# Patient Record
Sex: Female | Born: 1980
Health system: Southern US, Community
[De-identification: ages and names within clinical notes are randomized; demographics above are authoritative.]

## PROBLEM LIST (undated history)

## (undated) DIAGNOSIS — H547 Unspecified visual loss: Secondary | ICD-10-CM

## (undated) DIAGNOSIS — F419 Anxiety disorder, unspecified: Secondary | ICD-10-CM

## (undated) DIAGNOSIS — J40 Bronchitis, not specified as acute or chronic: Secondary | ICD-10-CM

## (undated) DIAGNOSIS — R0683 Snoring: Secondary | ICD-10-CM

## (undated) DIAGNOSIS — M797 Fibromyalgia: Secondary | ICD-10-CM

## (undated) DIAGNOSIS — K219 Gastro-esophageal reflux disease without esophagitis: Secondary | ICD-10-CM

## (undated) DIAGNOSIS — N926 Irregular menstruation, unspecified: Secondary | ICD-10-CM

## (undated) DIAGNOSIS — K589 Irritable bowel syndrome without diarrhea: Secondary | ICD-10-CM

## (undated) DIAGNOSIS — G8929 Other chronic pain: Secondary | ICD-10-CM

## (undated) DIAGNOSIS — G43909 Migraine, unspecified, not intractable, without status migrainosus: Secondary | ICD-10-CM

## (undated) DIAGNOSIS — F329 Major depressive disorder, single episode, unspecified: Secondary | ICD-10-CM

## (undated) DIAGNOSIS — I7300 Raynaud's syndrome without gangrene: Secondary | ICD-10-CM

## (undated) DIAGNOSIS — F32A Depression, unspecified: Secondary | ICD-10-CM

## (undated) DIAGNOSIS — M25559 Pain in unspecified hip: Secondary | ICD-10-CM

## (undated) DIAGNOSIS — F4320 Adjustment disorder, unspecified: Secondary | ICD-10-CM

## (undated) DIAGNOSIS — D649 Anemia, unspecified: Secondary | ICD-10-CM

## (undated) DIAGNOSIS — R682 Dry mouth, unspecified: Secondary | ICD-10-CM

## (undated) DIAGNOSIS — D242 Benign neoplasm of left breast: Secondary | ICD-10-CM

## (undated) HISTORY — DX: Benign neoplasm of left breast: D24.2

## (undated) HISTORY — DX: Other chronic pain: G89.29

## (undated) HISTORY — DX: Unspecified visual loss: H54.7

## (undated) HISTORY — DX: Anemia, unspecified: D64.9

## (undated) HISTORY — DX: Snoring: R06.83

## (undated) HISTORY — PX: BREAST BIOPSY: SHX20

## (undated) HISTORY — DX: Pain in unspecified hip: M25.559

## (undated) HISTORY — DX: Adjustment disorder, unspecified: F43.20

## (undated) HISTORY — DX: Bronchitis, not specified as acute or chronic: J40

---

## 2013-06-27 DIAGNOSIS — K047 Periapical abscess without sinus: Secondary | ICD-10-CM | POA: Diagnosis not present

## 2013-06-27 DIAGNOSIS — K029 Dental caries, unspecified: Secondary | ICD-10-CM | POA: Diagnosis not present

## 2013-06-27 DIAGNOSIS — K089 Disorder of teeth and supporting structures, unspecified: Secondary | ICD-10-CM | POA: Diagnosis not present

## 2013-09-10 DIAGNOSIS — J209 Acute bronchitis, unspecified: Secondary | ICD-10-CM | POA: Diagnosis not present

## 2014-03-21 DIAGNOSIS — R1084 Generalized abdominal pain: Secondary | ICD-10-CM | POA: Diagnosis not present

## 2014-03-24 DIAGNOSIS — K7689 Other specified diseases of liver: Secondary | ICD-10-CM | POA: Diagnosis not present

## 2014-03-24 DIAGNOSIS — R1084 Generalized abdominal pain: Secondary | ICD-10-CM | POA: Diagnosis not present

## 2014-04-09 DIAGNOSIS — K7689 Other specified diseases of liver: Secondary | ICD-10-CM | POA: Diagnosis not present

## 2014-04-09 DIAGNOSIS — R1011 Right upper quadrant pain: Secondary | ICD-10-CM | POA: Diagnosis not present

## 2014-10-10 ENCOUNTER — Emergency Department (INDEPENDENT_AMBULATORY_CARE_PROVIDER_SITE_OTHER)
Admission: EM | Admit: 2014-10-10 | Discharge: 2014-10-10 | Disposition: A | Discharge status: Home / Self Care | Payer: Medicare Other | Source: Home / Self Care | Attending: Family Medicine | Admitting: Family Medicine

## 2014-10-10 ENCOUNTER — Encounter (HOSPITAL_COMMUNITY): Payer: Self-pay | Admitting: Emergency Medicine

## 2014-10-10 DIAGNOSIS — T148 Other injury of unspecified body region: Secondary | ICD-10-CM

## 2014-10-10 DIAGNOSIS — W57XXXA Bitten or stung by nonvenomous insect and other nonvenomous arthropods, initial encounter: Secondary | ICD-10-CM

## 2014-10-10 HISTORY — DX: Fibromyalgia: M79.7

## 2014-10-10 HISTORY — DX: Migraine, unspecified, not intractable, without status migrainosus: G43.909

## 2014-10-10 HISTORY — DX: Major depressive disorder, single episode, unspecified: F32.9

## 2014-10-10 HISTORY — DX: Irregular menstruation, unspecified: N92.6

## 2014-10-10 HISTORY — DX: Irritable bowel syndrome without diarrhea: K58.9

## 2014-10-10 HISTORY — DX: Depression, unspecified: F32.A

## 2014-10-10 MED ORDER — FLUTICASONE PROPIONATE 0.05 % EX CREA: TOPICAL_CREAM | Freq: Two times a day (BID) | CUTANEOUS | Status: DC

## 2014-10-10 MED ORDER — PERMETHRIN 5 % EX CREA: TOPICAL_CREAM | CUTANEOUS | Status: DC

## 2014-10-10 MED ORDER — HYDROXYZINE HCL 25 MG PO TABS: 25.0000 mg | ORAL_TABLET | Freq: Four times a day (QID) | ORAL | Status: DC

## 2014-10-10 MED ORDER — METHYLPREDNISOLONE ACETATE 80 MG/ML IJ SUSP
80.0000 mg | Freq: Once | INTRAMUSCULAR | Status: AC
  Administered 2014-10-10: 80 mg via INTRAMUSCULAR

## 2014-10-10 MED ORDER — METHYLPREDNISOLONE ACETATE 80 MG/ML IJ SUSP
INTRAMUSCULAR | Status: AC
  Filled 2014-10-10: qty 1

## 2014-10-10 NOTE — ED Notes (Signed)
Mother and child are being seen in the same treatment room, same provider.

## 2014-10-10 NOTE — ED Notes (Signed)
Concerned for chiggers.  Has tried calamine lotion, benadryl

## 2014-10-10 NOTE — ED Provider Notes (Signed)
CSN: [id number]     Arrival date & time 10/10/14  1329 History   First MD Initiated Contact with Patient 10/10/14 1514     Chief Complaint  Patient presents with  . Rash   (Consider location/radiation/quality/duration/timing/severity/associated sxs/prior Treatment) Patient is a 34 y.o. female presenting with rash. The history is provided by the patient.  Rash Location:  Torso and leg Leg rash location:  L lower leg, L upper leg, R upper leg and R lower leg Quality: itchiness   Severity:  Moderate Onset quality:  Sudden Duration:  3 weeks Progression:  Spreading Chronicity:  New Context: insect bite/sting   Context comment:  [place] when husband and daughter developed similar rash , has not improved in spite of different care.   Past Medical History  Diagnosis Date  . Fibromyalgia   . Depression   . IBS (irritable bowel syndrome)   . Migraine   . Menstrual abnormality    Past Surgical History  Procedure Laterality Date  . Cesarean section     No family history on file. Social History  Substance Use Topics  . Smoking status: Never Smoker   . Smokeless tobacco: None  . Alcohol Use: No   OB History    No data available     Review of Systems  Skin: Positive for rash.  All other systems reviewed and are negative.   Allergies  Review of patient's allergies indicates no known allergies.  Home Medications   Prior to Admission medications   Medication Sig Start Date End Date Taking? Authorizing Provider  calamine lotion Apply 1 application topically as needed for itching.   Yes Historical Provider, MD  DiphenhydrAMINE HCl (BENADRYL ALLERGY PO) Take by mouth.   Yes Historical Provider, MD  fluticasone (CUTIVATE) 0.05 % cream Apply topically 2 (two) times daily. 10/10/14   Billy Fischer, MD  Homeopathic Products (ALLERGY MEDICINE PO) Take by mouth.    Historical Provider, MD  hydrOXYzine (ATARAX/VISTARIL) 25 MG tablet Take 1 tablet (25 mg total) by  mouth every 6 (six) hours. Prn itching 10/10/14   Billy Fischer, MD  permethrin (ELIMITE) 5 % cream Use as directed on package, repeat in 1 week 10/10/14   Billy Fischer, MD   Meds Ordered and Administered this Visit   Medications  methylPREDNISolone acetate (DEPO-MEDROL) injection 80 mg (not administered)    BP 135/93 mmHg  Pulse 82  Temp(Src) 98.3 F (36.8 C) (Oral)  Resp 16  SpO2 100%  LMP 09/09/2014 No data found.   Physical Exam  Constitutional: She is oriented to person, place, and time. She appears well-developed and well-nourished. No distress.  Neurological: She is alert and oriented to person, place, and time.  Skin: Skin is warm and dry. Rash noted.  Diffuse pruritic erythematous papular rash on lower body  Nursing note and vitals reviewed.   ED Course  Procedures (including critical care time)  Labs Review Labs Reviewed - No data to display  Imaging Review No results found.   Visual Acuity Review  Right Eye Distance:   Left Eye Distance:   Bilateral Distance:    Right Eye Near:   Left Eye Near:    Bilateral Near:         MDM   1. Multiple insect bites    rx for permithrin, atarax, and depomedrol and cutivate.    Billy Fischer, MD 10/10/14 [phone]

## 2014-12-16 ENCOUNTER — Emergency Department (HOSPITAL_COMMUNITY): Payer: Medicare Other

## 2014-12-16 ENCOUNTER — Emergency Department (HOSPITAL_COMMUNITY)
Admission: EM | Admit: 2014-12-16 | Discharge: 2014-12-16 | Disposition: A | Discharge status: Home / Self Care | Payer: Medicare Other | Attending: Emergency Medicine | Admitting: Emergency Medicine

## 2014-12-16 ENCOUNTER — Encounter (HOSPITAL_COMMUNITY): Payer: Self-pay | Admitting: *Deleted

## 2014-12-16 DIAGNOSIS — S3992XA Unspecified injury of lower back, initial encounter: Secondary | ICD-10-CM | POA: Insufficient documentation

## 2014-12-16 DIAGNOSIS — Z8719 Personal history of other diseases of the digestive system: Secondary | ICD-10-CM | POA: Diagnosis not present

## 2014-12-16 DIAGNOSIS — Y998 Other external cause status: Secondary | ICD-10-CM | POA: Diagnosis not present

## 2014-12-16 DIAGNOSIS — Z8659 Personal history of other mental and behavioral disorders: Secondary | ICD-10-CM | POA: Insufficient documentation

## 2014-12-16 DIAGNOSIS — M549 Dorsalgia, unspecified: Secondary | ICD-10-CM | POA: Diagnosis not present

## 2014-12-16 DIAGNOSIS — S40022A Contusion of left upper arm, initial encounter: Secondary | ICD-10-CM | POA: Insufficient documentation

## 2014-12-16 DIAGNOSIS — Z8679 Personal history of other diseases of the circulatory system: Secondary | ICD-10-CM | POA: Insufficient documentation

## 2014-12-16 DIAGNOSIS — Y9289 Other specified places as the place of occurrence of the external cause: Secondary | ICD-10-CM | POA: Diagnosis not present

## 2014-12-16 DIAGNOSIS — Y9389 Activity, other specified: Secondary | ICD-10-CM | POA: Insufficient documentation

## 2014-12-16 DIAGNOSIS — Z8742 Personal history of other diseases of the female genital tract: Secondary | ICD-10-CM | POA: Diagnosis not present

## 2014-12-16 DIAGNOSIS — M545 Low back pain: Secondary | ICD-10-CM

## 2014-12-16 MED ORDER — NAPROXEN 500 MG PO TABS: 500.0000 mg | ORAL_TABLET | Freq: Two times a day (BID) | ORAL | Status: DC

## 2014-12-16 MED ORDER — IBUPROFEN 400 MG PO TABS
800.0000 mg | ORAL_TABLET | Freq: Once | ORAL | Status: AC
  Administered 2014-12-16: 800 mg via ORAL
  Filled 2014-12-16: qty 2

## 2014-12-16 NOTE — ED Notes (Signed)
See PA assessment 
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
## 2014-12-16 NOTE — Discharge Instructions (Signed)
Back Pain, Adult °Back pain is very common in adults. The cause of back pain is rarely dangerous and the pain often gets better over time. The cause of your back pain may not be known. Some common causes of back pain include: °· Strain of the muscles or ligaments supporting the spine. °· Wear and tear (degeneration) of the spinal disks. °· Arthritis. °· Direct injury to the back. °For many people, back pain may return. Since back pain is rarely dangerous, most people can learn to manage this condition on their own. °HOME CARE INSTRUCTIONS °Watch your back pain for any changes. The following actions may help to lessen any discomfort you are feeling: °· Remain active. It is stressful on your back to sit or stand in one place for long periods of time. Do not sit, drive, or stand in one place for more than 30 minutes at a time. Take short walks on even surfaces as soon as you are able. Try to increase the length of time you walk each day. °· Exercise regularly as directed by your health care provider. Exercise helps your back heal faster. It also helps avoid future injury by keeping your muscles strong and flexible. °· Do not stay in bed. Resting more than 1-2 days can delay your recovery. °· Pay attention to your body when you bend and lift. The most comfortable positions are those that put less stress on your recovering back. Always use proper lifting techniques, including: °¨ Bending your knees. °¨ Keeping the load close to your body. °¨ Avoiding twisting. °· Find a comfortable position to sleep. Use a firm mattress and lie on your side with your knees slightly bent. If you lie on your back, put a pillow under your knees. °· Avoid feeling anxious or stressed. Stress increases muscle tension and can worsen back pain. It is important to recognize when you are anxious or stressed and learn ways to manage it, such as with exercise. °· Take medicines only as directed by your health care provider. Over-the-counter  medicines to reduce pain and inflammation are often the most helpful. Your health care provider may prescribe muscle relaxant drugs. These medicines help dull your pain so you can more quickly return to your normal activities and healthy exercise. °· Apply ice to the injured area: °¨ Put ice in a plastic bag. °¨ Place a towel between your skin and the bag. °¨ Leave the ice on for 20 minutes, 2-3 times a day for the first 2-3 days. After that, ice and heat may be alternated to reduce pain and spasms. °· Maintain a healthy weight. Excess weight puts extra stress on your back and makes it difficult to maintain good posture. °SEEK MEDICAL CARE IF: °· You have pain that is not relieved with rest or medicine. °· You have increasing pain going down into the legs or buttocks. °· You have pain that does not improve in one week. °· You have night pain. °· You lose weight. °· You have a fever or chills. °SEEK IMMEDIATE MEDICAL CARE IF:  °· You develop new bowel or bladder control problems. °· You have unusual weakness or numbness in your arms or legs. °· You develop nausea or vomiting. °· You develop abdominal pain. °· You feel faint. °  °This information is not intended to replace advice given to you by your health care provider. Make sure you discuss any questions you have with your health care provider. °  °Document Released: 01/07/2005 Document Revised: 01/28/2014 Document Reviewed: 05/11/2013 °Elsevier Interactive Patient Education ©2016 Elsevier   Inc. ° °Emergency Department Resource Guide °1) Find a Doctor and Pay Out of Pocket °Although you won't have to find out who is covered by your insurance plan, it is a good idea to ask around and get recommendations. You will then need to call the office and see if the doctor you have chosen will accept you as a new patient and what types of options they offer for patients who are self-pay. Some doctors offer discounts or will set up payment plans for their patients who do not  have insurance, but you will need to ask so you aren't surprised when you get to your appointment. ° °2) Contact Your Local Health Department °Not all health departments have doctors that can see patients for sick visits, but many do, so it is worth a call to see if yours does. If you don't know where your local health department is, you can check in your phone book. The CDC also has a tool to help you locate your state's health department, and many state websites also have listings of all of their local health departments. ° °3) Find a Walk-in Clinic °If your illness is not likely to be very severe or complicated, you may want to try a walk in clinic. These are popping up all over the country in pharmacies, drugstores, and shopping centers. They're usually staffed by nurse practitioners or physician assistants that have been trained to treat common illnesses and complaints. They're usually fairly quick and inexpensive. However, if you have serious medical issues or chronic medical problems, these are probably not your best option. ° °No Primary Care Doctor: °- Call [facility] at  832-8000 - they can help you locate a primary care doctor that  accepts your insurance, provides certain services, etc. °- Physician Referral Service- 1-800-533-3463 ° °Chronic Pain Problems: °Organization         Address  Phone   Notes  °[facility]  (336) 297-2271 Patients need to be referred by their primary care doctor.  ° °Medication Assistance: °Organization         Address  Phone   Notes  °[place] [facility] [address] °[place], [place] [address] (336) 641-8030 --Must be a resident of [place] °-- Must have NO insurance coverage whatsoever (no Medicaid/ Medicare, etc.) °-- The pt. MUST have a primary care doctor that directs their care regularly and follows them in the community °  °[facility]  (866) 331-1348   °[employer]  (888) 892-1162   ° °Agencies that  provide inexpensive medical care: °Organization         Address  Phone   Notes  °[facility]  (336) 832-8035   °[facility]    (336) 832-7272   °[facility] [address] °[place], [place] [address] (336) 832-4777   °[facility] [address], °[place] (336) 271-4999   °[facility]    (336) 373-0678   °[facility]    (336) 272-1050   °[facility] ° [address], [place] Phone:  (336) 832-4444, Fax:  (336) 832-4440 Hours of Operation:  9 am - 6 pm, M-F.  Also accepts Medicaid/Medicare and self-pay.  °[facility] ° [address],  Phone: (336) 832-3150, Fax: (336) 832-3151. Hours of Operation:  8:30 am - 5:30 pm, M-F.  Also accepts Medicaid and self-pay.  °[facility]   [place] [address], [place] Phone: (336) 878-6027   °[facility] [address], [place], [place] (336)723-1848, Ext. 123 Mondays & Thursdays: 7-9 AM.  First 15 patients are seen on a first come, first serve basis. °  ° °Medicaid-accepting [place] Providers: ° °Organization         Address  Phone   Notes  °[facility] [address], [place] (336) 641-2100 Also accepts self-pay patients.  °[facility] [address], [place] ° (336) 856-9996   °[facility] [address], [place] (336) 288-8857   °[facility] [address] [place] [address], [place] (336) 299-7000   °Veita Bland [address], [place]  ° (336) 373-1557 Only accepts [place] Access Medicaid patients after they have their name applied to their card.  ° °Self-Pay (no insurance) in [place]: ° °Organization         Address  Phone   Notes  °Sickle Cell Patients, [facility] [address], [place] (336) 832-1970   °[facility] [address], [place] (336) 832-4400   °[facility] ° [address], [place] (336) 992-4800   °[facility]/Dr. Osei-Bonsu ° [address] [place] [address], [place] or [address], [place] (336) 841-8500 Phone number for both [place] and [place] locations is the same.  °[facility] [address], [place] (336) 299-0000   °[facility] [address] [place] [address], [place] or [address] (336) 852-7530 °(336) 878-2260   °[facility] [address], [place] (336) 350-1642, phone; (336) 294-5005, fax Sees patients 1st and 3rd Saturday of every month.  Must not qualify for public or private insurance (i.e. Medicaid, Medicare, [place] Health Choice, Veterans' Benefits) • Household income should be no more than 200% of the poverty level •The clinic cannot treat you if you are pregnant or think you are pregnant • Sexually transmitted diseases are not treated at the clinic.  ° ° °Dental Care: °Organization         Address  Phone  Notes  °[facility] [address], [place] (336) 641-6152 Accepts children up to age 21 who are enrolled in Medicaid or [place] Health Choice; pregnant women with a Medicaid card; and children who have applied for Medicaid or [place] Health Choice, but were declined, whose parents can pay a reduced fee at time of service.  °[facility] [place]  [address], [place] (336) 641-7733 Accepts children up to age 21 who are enrolled in Medicaid or [place] Health Choice; pregnant women with a Medicaid card; and children who have applied for Medicaid or [place] Health Choice, but were declined, whose parents can pay a reduced fee at time of service.  °[facility] ° [address], [place] (336) 641-4533 Patients are seen by appointment only. Walk-ins  are not accepted. [facility] will see patients 18 years of age and older. °Monday - Tuesday (8am-5pm) °Most Wednesdays (8:30-5pm) °$30 per visit, cash only  °[facility] ° [address], [place] (336) 641-4533 Patients are seen by appointment only. Walk-ins are not accepted. [facility] will see patients 18 years of age   and older. °One Wednesday Evening (Monthly: Volunteer Based).  $30 per visit, cash only  °[facility]  (919) 537-3737 for adults; Children under age 4, call Graduate Pediatric Dentistry at (919) [phone]. Children aged 4-14, please call (919) [phone] to request a pediatric application. ° Dental services are provided in all areas of dental care including fillings, crowns and bridges, complete and partial dentures, implants, gum treatment, root canals, and extractions. Preventive care is also provided. Treatment is provided to both adults and children. °Patients are selected via a lottery and there is often a waiting list. °  °[facility] [address], °[place] ° (336) 763-8833 [email or web address] °  °[facility] [address], [place], [place] (336)723-1848, Ext. 123 Second and Fourth Thursday of each month, opens at 6:30 AM; Clinic ends at 9 AM.  Patients are seen on a first-come first-served basis, and a limited number are seen during each clinic.  ° °[facility] ° [address], [place], [place] (336) 723-7904   Eligibility Requirements °You must have lived in [place], [place], or [place] counties for at least the last three months. °  You cannot be eligible for state or federal sponsored healthcare insurance, including Veterans Administration, Medicaid, or Medicare. °  You generally cannot be eligible for healthcare insurance through your employer.  °  How to apply: °Eligibility screenings are held every Tuesday and Wednesday afternoon from 1:00 pm until 4:00 pm. You do not need an appointment  for the interview!  °[facility] [address], [place], [place] 336-631-2330   °[facility]  336-342-8273   °[facility]  336-703-3100   °[facility]  336-570-6415   ° °Behavioral Health Resources in the Community: °Intensive Outpatient Programs °Organization         Address  Phone  Notes  °[facility] [address], [place], [place] 336-878-6098   °[facility] [address], [place], [place] 336-832-9800   °[facility] [address], [place], [place] ° 336-882-2125   °[facility] [address],  °[place], [place] 1-800-853-5163 or 336-641-4981   °Substance Abuse Resources °Organization         Address  Phone  Notes  °[facility]  336-882-2125   °[facility]  336-784-9470   °[facility]  336-285-9073   °[facility]  336-845-3988   °Residential & Outpatient Substance Abuse Program  1-800-659-3381   °Psychological Services °Organization         Address  Phone  Notes  °[facility]  336- 832-9600   °[facility]  336- 378-7881   °[facility] [address], [place] 1-800-853-5163 or 336-641-4981   ° °Mobile Crisis Teams °Organization         Address  Phone  Notes  °[facility], Mobile Crisis Care Unit  1-877-626-1772   °[facility] °[facility] ° [address] [place], [place] 336-834-9664   °Sharon DeEsch [address] °[place] 336-554-5454   ° °Self-Help/Support Groups °Organization         Address  Phone             Notes  °[facility] - variety of support groups  336- 373-1402 Call for more information  °Narcotics Anonymous (NA), [facility] [address], °[place]  2 meetings at   this location  ° °Residential Treatment Programs °Organization         Address  Phone  Notes  °[facility] [address],    °[place]  1-866-801-8205   °[facility] ° [address], [place], [place] 704-293-8524   °[facility] [address], [place] 336-845-3988 Admissions: 8am-3pm M-F  °[facility] [address]    °[place], [place] 336-841-1104   °[facility] [address], [place], [place] 336-379-7146   °[facility] [address]  °[place], [place] 336-285-9073   °[facility] Intensive Outpatient [address], [place], [place] 336-852-3033   °[facility] [address]  °[place], [place] 1-877-615-2722 or 336-784-9470   °[facility] ([facility] [address] [place], [place] 336-227-7417 Accepts Medicaid  °[facility] [address]  °[place] 1-800-659-3381 Substance Abuse/Addiction Treatment  ° °[place] Behavioral Health Resources °Organization         Address  Phone  Notes  °[facility]  (888) 581-9988   °Julie Brannon, PhD [address] [place], [place]   (336) 349-5553 or (336) 951-0000   °[facility]   [address] °[place], [place] (336) 349-4454   °[facility] [address], [place], [place] (336) 342-8316 Insurance/Medicaid/sponsorship through [facility]  °[facility] [address]                                    [place], [place] (336) 342-8316 Therapy/tele-psych/case  °[facility] [address]  ° [place], [place] (336) 349-2233    °Dr. Arfeen  (336) 349-4544   °[facility] [place]  [employer] [place] [facility] 1) [address],  °2) [address], [place] °3)  [address], [place] (336) 349-3220 °(336) 342-7768 ° °(336) 342-8140   °[place] [facility] (336) 342-1394 or (336) 342-3537 (After Hours)    ° ° ° °
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
## 2015-01-20 DIAGNOSIS — F410 Panic disorder [episodic paroxysmal anxiety] without agoraphobia: Secondary | ICD-10-CM | POA: Diagnosis not present

## 2015-04-10 ENCOUNTER — Encounter (HOSPITAL_COMMUNITY): Payer: Self-pay | Admitting: Emergency Medicine

## 2015-04-10 ENCOUNTER — Emergency Department (INDEPENDENT_AMBULATORY_CARE_PROVIDER_SITE_OTHER)
Admission: EM | Admit: 2015-04-10 | Discharge: 2015-04-10 | Disposition: A | Discharge status: Home / Self Care | Payer: Medicare Other | Source: Home / Self Care | Attending: Family Medicine | Admitting: Family Medicine

## 2015-04-10 DIAGNOSIS — G8929 Other chronic pain: Secondary | ICD-10-CM | POA: Diagnosis not present

## 2015-04-10 DIAGNOSIS — M25551 Pain in right hip: Secondary | ICD-10-CM | POA: Diagnosis not present

## 2015-04-10 MED ORDER — MELOXICAM 7.5 MG PO TABS: 7.5000 mg | ORAL_TABLET | Freq: Two times a day (BID) | ORAL | Status: DC
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
## 2015-04-10 NOTE — Discharge Instructions (Signed)
Use medicine as prescribed and see orthopedist for further eval and care.
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
## 2015-08-29 ENCOUNTER — Encounter (HOSPITAL_COMMUNITY): Payer: Self-pay | Admitting: Emergency Medicine

## 2015-08-29 ENCOUNTER — Emergency Department (HOSPITAL_COMMUNITY)
Admission: EM | Admit: 2015-08-29 | Discharge: 2015-08-29 | Disposition: A | Discharge status: Home / Self Care | Payer: Medicare Other | Attending: Emergency Medicine | Admitting: Emergency Medicine

## 2015-08-29 DIAGNOSIS — H9201 Otalgia, right ear: Secondary | ICD-10-CM | POA: Insufficient documentation

## 2015-08-29 DIAGNOSIS — Z79899 Other long term (current) drug therapy: Secondary | ICD-10-CM | POA: Diagnosis not present

## 2015-08-29 MED ORDER — OXYCODONE-ACETAMINOPHEN 5-325 MG PO TABS
1.0000 | ORAL_TABLET | Freq: Once | ORAL | Status: AC
Start: 2015-08-29 — End: 2015-08-29
  Administered 2015-08-29: 1 via ORAL
  Filled 2015-08-29: qty 1

## 2015-08-29 MED ORDER — AMOXICILLIN 500 MG PO CAPS: 500.0000 mg | ORAL_CAPSULE | Freq: Three times a day (TID) | ORAL | 0 refills | Status: DC

## 2015-08-29 MED ORDER — HYDROCODONE-ACETAMINOPHEN 5-325 MG PO TABS: ORAL_TABLET | ORAL | 0 refills | Status: DC

## 2015-08-29 NOTE — ED Triage Notes (Signed)
Patient states "I have ear infections off and on for months and I haven't been able to see my doctor so this morning I guess I put too much lemon oil in it." Patient tearful at triage. Complaining of pain to right ear.

## 2015-08-29 NOTE — Discharge Instructions (Signed)
Avoid putting anything in your ear.  Return here for any worsening symptoms

## 2015-08-30 ENCOUNTER — Emergency Department (HOSPITAL_COMMUNITY)
Admission: EM | Admit: 2015-08-30 | Discharge: 2015-08-30 | Disposition: A | Discharge status: Home / Self Care | Payer: Medicare Other | Attending: Emergency Medicine | Admitting: Emergency Medicine

## 2015-08-30 ENCOUNTER — Encounter (HOSPITAL_COMMUNITY): Payer: Self-pay | Admitting: Emergency Medicine

## 2015-08-30 DIAGNOSIS — H66011 Acute suppurative otitis media with spontaneous rupture of ear drum, right ear: Secondary | ICD-10-CM | POA: Insufficient documentation

## 2015-08-30 DIAGNOSIS — H9201 Otalgia, right ear: Secondary | ICD-10-CM | POA: Diagnosis present

## 2015-08-30 MED ORDER — NEOMYCIN-POLYMYXIN-HC 3.5-10000-1 OT SUSP: 4.0000 [drp] | Freq: Three times a day (TID) | OTIC | 0 refills | Status: AC

## 2015-08-30 MED ORDER — OXYCODONE-ACETAMINOPHEN 5-325 MG PO TABS: 2.0000 | ORAL_TABLET | ORAL | 0 refills | Status: DC | PRN

## 2015-08-30 NOTE — Discharge Instructions (Signed)
Finish the antibiotic you were prescribed yesterday.  Add the drops as prescribed. You may take the oxycodone prescribed for pain relief in place of the hydrocodone you were prescribed yesterday.   This will make you drowsy - do not drive within 4 hours of taking this medication.

## 2015-08-30 NOTE — ED Notes (Signed)
Patient was prescribed hydrocodone-acetaminophen 5/325 mg and did not want the remaining pills,  The remaining pills where discarded in sharp container in the [place] room in fast track.

## 2015-08-30 NOTE — ED Provider Notes (Signed)
[place] DEPT Provider Note   CSN: [id number] Arrival date & time: 08/30/15  1827  First Provider Contact:  First MD Initiated Contact with Patient 08/30/15 1855        History   Chief Complaint Chief Complaint  Patient presents with  . Otalgia    HPI Destiny Fischer is a 35 y.o. female.  Destiny Fischer is a 35 y.o. Female presenting with persistent right ear pain and now white drainage from the ear since her visit here yesterday during which time she was treated for an ear infection with amoxil and hydrocodone given severe pain.  She reports the ear was also flushed during that visit.  She now has white watery drainage from the ear canal but endorses persistent pain. She denies fevers, chills, bloody discharge, headache or uri type symptoms such as nasal congestion. She states her fibromyalgia makes her pain most intense and hard to control.  She was prescribed hydrocodone yesterday and took 3 tablets but it has not relieved her pain. She presents with this medicine and wants to turn it in so we can destroy it, but is asking for something stronger.      The history is provided by the patient.    Past Medical History:  Diagnosis Date  . Depression   . Fibromyalgia   . IBS (irritable bowel syndrome)   . Menstrual abnormality   . Migraine     There are no active problems to display for this patient.   Past Surgical History:  Procedure Laterality Date  . CESAREAN SECTION      OB History    No data available       Home Medications    Prior to Admission medications   Medication Sig Start Date End Date Taking? Authorizing Provider  amoxicillin (AMOXIL) 500 MG capsule Take 1 capsule (500 mg total) by mouth 3 (three) times daily. 08/29/15   Tammy Triplett, PA-C  Ascorbic Acid (VITAMIN C PO) Take 1 tablet by mouth daily as needed (sickness).    Historical Provider, MD  guaifenesin (ROBITUSSIN) 100 MG/5ML syrup Take 200 mg by mouth 3 (three) times daily as needed for  cough.    Historical Provider, MD  HYDROcodone-acetaminophen (NORCO/VICODIN) 5-325 MG tablet Take one tab po q 4-6 hrs prn pain 08/29/15   Tammy Triplett, PA-C  IRON PO Take 1 tablet by mouth daily.    Historical Provider, MD  MAGNESIUM PO Take 1 tablet by mouth daily.    Historical Provider, MD  meloxicam (MOBIC) 7.5 MG tablet Take 1 tablet (7.5 mg total) by mouth 2 (two) times daily after a meal. Patient not taking: Reported on 08/29/2015 04/10/15   Billy Fischer, MD  Multiple Vitamin (MULTIVITAMINS PO) Take by mouth.    Historical Provider, MD  naproxen (NAPROSYN) 500 MG tablet Take 1 tablet (500 mg total) by mouth 2 (two) times daily. Patient not taking: Reported on 08/29/2015 12/16/14   Gloriann Loan, PA-C  naproxen sodium (ANAPROX) 220 MG tablet Take 220 mg by mouth 2 (two) times daily as needed (headache).    Historical Provider, MD  neomycin-polymyxin-hydrocortisone (CORTISPORIN) 3.5-10000-1 otic suspension Place 4 drops into the right ear 3 (three) times daily. 08/30/15 09/09/15  Evalee Jefferson, PA-C  OVER THE COUNTER MEDICATION Take 2 tablets by mouth daily as needed (menstrual cycle).     Historical Provider, MD  oxyCODONE-acetaminophen (PERCOCET/ROXICET) 5-325 MG tablet Take 2 tablets by mouth every 4 (four) hours as needed for severe pain. 08/30/15   Almyra Free  Arria Naim, PA-C    Family History History reviewed. No pertinent family history.  Social History Social History  Substance Use Topics  . Smoking status: Never Smoker  . Smokeless tobacco: Never Used  . Alcohol use No     Allergies   Other   Review of Systems Review of Systems  Constitutional: Negative for chills and fever.  HENT: Positive for ear discharge and ear pain. Negative for congestion, postnasal drip, rhinorrhea, sinus pressure, sore throat, trouble swallowing and voice change.   Eyes: Negative for discharge.  Respiratory: Negative for cough, shortness of breath, wheezing and stridor.   Cardiovascular: Negative for chest pain.    Gastrointestinal: Negative for abdominal pain.  Genitourinary: Negative.      Physical Exam Updated Vital Signs BP 120/83 (BP Location: Left Arm)   Pulse 81   Temp 99.5 F (37.5 C) (Oral)   Resp 16   Ht 5\' 2"  (1.575 m)   Wt 85.7 kg   LMP 08/15/2015   SpO2 98%   BMI 34.57 kg/m   Physical Exam  Constitutional: She appears well-developed and well-nourished.  HENT:  Head: Normocephalic and atraumatic.  Right Ear: Hearing normal. There is drainage.  Nose: Nose normal.  Mouth/Throat: Uvula is midline, oropharynx is clear and moist and mucous membranes are normal.  Unable to visualize TM secondary to white purulent dc.  No significant outer canal edema.   Eyes: Conjunctivae are normal.  Neck: Normal range of motion.  Cardiovascular: Normal rate, regular rhythm and normal heart sounds.   Pulmonary/Chest: Effort normal and breath sounds normal. She has no wheezes.  Abdominal: There is no tenderness.  Musculoskeletal: Normal range of motion.  Lymphadenopathy:    She has no cervical adenopathy.  Neurological: She is alert.  Skin: Skin is warm and dry.  Psychiatric:  Anxious, tearful.  Nursing note and vitals reviewed.    ED Treatments / Results  Labs (all labs ordered are listed, but only abnormal results are displayed) Labs Reviewed - No data to display  EKG  EKG Interpretation None       Radiology No results found.  Procedures Procedures (including critical care time)  Medications Ordered in ED Medications - No data to display   Initial Impression / Assessment and Plan / ED Course  I have reviewed the triage vital signs and the nursing notes.  Pertinent labs & imaging results that were available during my care of the patient were reviewed by me and considered in my medical decision making (see chart for details).  Clinical Course    Pt with suspected ruptured TM now with purulence in outer canal.  She left the hydrocodone prescribed here ytd as stated  was not working - this was discarded in the sharps bin.  Percocet prescribed.  Referral to ent for recheck in one week.  Cortisporin suspension prescribed. Advised to continue amoxil prescribed ytd.  Final Clinical Impressions(s) / ED Diagnoses   Final diagnoses:  Acute suppurative otitis media of right ear with spontaneous rupture of tympanic membrane, recurrence not specified    New Prescriptions New Prescriptions   NEOMYCIN-POLYMYXIN-HYDROCORTISONE (CORTISPORIN) 3.5-10000-1 OTIC SUSPENSION    Place 4 drops into the right ear 3 (three) times daily.   OXYCODONE-ACETAMINOPHEN (PERCOCET/ROXICET) 5-325 MG TABLET    Take 2 tablets by mouth every 4 (four) hours as needed for severe pain.     Evalee Jefferson, PA-C 08/31/15 1416    Ezequiel Essex, MD 08/31/15 [phone]

## 2015-08-30 NOTE — ED Triage Notes (Signed)
Pt reports having difficulty hearing and pain out of right ear since yesterday. Pt was seen here yesterday for ear pain and given percocet and flushed ear.  Pt ear did not improve with flushing. Pt prescribed amoxicillin and vicodin.

## 2015-08-31 NOTE — ED Provider Notes (Signed)
[facility] DEPT Provider Note   CSN: [id number] Arrival date & time: 08/29/15  1047  First Provider Contact:  First MD Initiated Contact with Patient 08/29/15 1121        History   Chief Complaint Chief Complaint  Patient presents with  . Otalgia    HPI Lashondria Silverwood is a 35 y.o. female.  HPI  Veletta Hargreaves is a 35 y.o. female who presents to the Emergency Department complaining of severe right ear pain for several days.  She states she has been using diluted lemon oil in her ear to help control pain.  She states that she woke up with worsening pain to her ear and used undiluted lemon oil and then began having worsening pain and burning sensation to her ear.  She comes to the ER holding ice packs to her ear stating that is only thing that will help control the pain.  She attempted to flush her ear with water without relief.  She denies fever, sore throat, dizziness, headache.  Past Medical History:  Diagnosis Date  . Depression   . Fibromyalgia   . IBS (irritable bowel syndrome)   . Menstrual abnormality   . Migraine     There are no active problems to display for this patient.   Past Surgical History:  Procedure Laterality Date  . CESAREAN SECTION      OB History    No data available       Home Medications    Prior to Admission medications   Medication Sig Start Date End Date Taking? Authorizing Provider  Ascorbic Acid (VITAMIN C PO) Take 1 tablet by mouth daily as needed (sickness).   Yes Historical Provider, MD  guaifenesin (ROBITUSSIN) 100 MG/5ML syrup Take 200 mg by mouth 3 (three) times daily as needed for cough.   Yes Historical Provider, MD  IRON PO Take 1 tablet by mouth daily.   Yes Historical Provider, MD  MAGNESIUM PO Take 1 tablet by mouth daily.   Yes Historical Provider, MD  Multiple Vitamin (MULTIVITAMINS PO) Take by mouth.   Yes Historical Provider, MD  naproxen sodium (ANAPROX) 220 MG tablet Take 220 mg by mouth 2 (two) times daily as needed  (headache).   Yes Historical Provider, MD  OVER THE COUNTER MEDICATION Take 2 tablets by mouth daily as needed (menstrual cycle).    Yes Historical Provider, MD  amoxicillin (AMOXIL) 500 MG capsule Take 1 capsule (500 mg total) by mouth 3 (three) times daily. 08/29/15   Arrian Manson, PA-C  HYDROcodone-acetaminophen (NORCO/VICODIN) 5-325 MG tablet Take one tab po q 4-6 hrs prn pain 08/29/15   Hedi Barkan, PA-C  meloxicam (MOBIC) 7.5 MG tablet Take 1 tablet (7.5 mg total) by mouth 2 (two) times daily after a meal. Patient not taking: Reported on 08/29/2015 04/10/15   Billy Fischer, MD  naproxen (NAPROSYN) 500 MG tablet Take 1 tablet (500 mg total) by mouth 2 (two) times daily. Patient not taking: Reported on 08/29/2015 12/16/14   Gloriann Loan, PA-C  neomycin-polymyxin-hydrocortisone (CORTISPORIN) 3.5-10000-1 otic suspension Place 4 drops into the right ear 3 (three) times daily. 08/30/15 09/09/15  Evalee Jefferson, PA-C  oxyCODONE-acetaminophen (PERCOCET/ROXICET) 5-325 MG tablet Take 2 tablets by mouth every 4 (four) hours as needed for severe pain. 08/30/15   Evalee Jefferson, PA-C    Family History History reviewed. No pertinent family history.  Social History Social History  Substance Use Topics  . Smoking status: Never Smoker  . Smokeless tobacco: Never Used  .  Alcohol use No     Allergies   Other   Review of Systems Review of Systems  Constitutional: Negative for activity change, appetite change, chills and fever.  HENT: Positive for ear pain. Negative for congestion, ear discharge, facial swelling, rhinorrhea, sore throat and trouble swallowing.   Eyes: Negative for visual disturbance.  Respiratory: Negative for cough, shortness of breath, wheezing and stridor.   Gastrointestinal: Negative for nausea and vomiting.  Musculoskeletal: Negative for neck pain and neck stiffness.  Skin: Negative.   Neurological: Negative for dizziness, numbness and headaches.  Hematological: Negative for adenopathy.    Psychiatric/Behavioral: Negative for confusion.  All other systems reviewed and are negative.    Physical Exam Updated Vital Signs BP (!) 136/111   Pulse 94   Temp 98.5 F (36.9 C)   Resp 18   Ht 5\' 2"  (1.575 m)   Wt 85.7 kg   LMP 08/15/2015   SpO2 96%   BMI 34.57 kg/m   Physical Exam  Constitutional: She is oriented to person, place, and time. She appears well-developed and well-nourished. No distress.  HENT:  Head: Normocephalic and atraumatic.  Right Ear: There is tenderness. No swelling. Tympanic membrane is bulging. Tympanic membrane is not erythematous. A middle ear effusion is present. No hemotympanum.  Left Ear: Tympanic membrane and ear canal normal.  Mouth/Throat: Uvula is midline, oropharynx is clear and moist and mucous membranes are normal. No uvula swelling. No oropharyngeal exudate.  Bulging of the rightTM.  Mild middle ear effusion present.  No drainage or edema of the ear canal, TM appears intact.    Neck: Normal range of motion. Neck supple.  Cardiovascular: Normal rate and regular rhythm.   Pulmonary/Chest: Effort normal and breath sounds normal. No stridor. No respiratory distress.  Musculoskeletal: Normal range of motion.  Lymphadenopathy:    She has no cervical adenopathy.  Neurological: She is alert and oriented to person, place, and time. Coordination normal.  Skin: Skin is warm and dry. No rash noted.  Nursing note and vitals reviewed.    ED Treatments / Results  Labs (all labs ordered are listed, but only abnormal results are displayed) Labs Reviewed - No data to display  EKG  EKG Interpretation None       Radiology No results found.  Procedures Procedures (including critical care time)  Medications Ordered in ED Medications  oxyCODONE-acetaminophen (PERCOCET/ROXICET) 5-325 MG per tablet 1 tablet (1 tablet Oral Given 08/29/15 1126)     Initial Impression / Assessment and Plan / ED Course  I have reviewed the triage vital signs  and the nursing notes.  Pertinent labs & imaging results that were available during my care of the patient were reviewed by me and considered in my medical decision making (see chart for details).  Clinical Course    Pt with bulging of the Right TM.  Used lemon oil in her ear at home.  No edema of canal.    Right ear gently flushed with saline.  Pt feeling better.  rx for amoxil and vicodin  Final Clinical Impressions(s) / ED Diagnoses   Final diagnoses:  Otalgia of right ear    New Prescriptions Discharge Medication List as of 08/29/2015 12:24 PM    START taking these medications   Details  amoxicillin (AMOXIL) 500 MG capsule Take 1 capsule (500 mg total) by mouth 3 (three) times daily., Starting Tue 08/29/2015, Print    HYDROcodone-acetaminophen (NORCO/VICODIN) 5-325 MG tablet Take one tab po q 4-6 hrs prn  pain, Print         Kem Parkinson, PA-C 08/31/15 2026    Veryl Speak, MD 09/04/15 [phone]

## 2015-09-01 ENCOUNTER — Encounter (HOSPITAL_COMMUNITY): Payer: Self-pay | Admitting: Emergency Medicine

## 2015-09-01 ENCOUNTER — Emergency Department (HOSPITAL_COMMUNITY)
Admission: EM | Admit: 2015-09-01 | Discharge: 2015-09-01 | Disposition: A | Discharge status: Home / Self Care | Payer: Medicare Other | Attending: Emergency Medicine | Admitting: Emergency Medicine

## 2015-09-01 DIAGNOSIS — Z792 Long term (current) use of antibiotics: Secondary | ICD-10-CM | POA: Insufficient documentation

## 2015-09-01 DIAGNOSIS — H9211 Otorrhea, right ear: Secondary | ICD-10-CM | POA: Insufficient documentation

## 2015-09-01 DIAGNOSIS — Z79899 Other long term (current) drug therapy: Secondary | ICD-10-CM | POA: Insufficient documentation

## 2015-09-01 DIAGNOSIS — H9201 Otalgia, right ear: Secondary | ICD-10-CM | POA: Diagnosis present

## 2015-09-01 NOTE — ED Triage Notes (Signed)
Pt here for right ear pain for 3 days. Pt ear has been bleeding, pt has ruptured eardrum diagnosed 2 days ago here in ED. Pt has been on antibiotics and pain medication.

## 2015-09-01 NOTE — ED Provider Notes (Signed)
[facility] DEPT Provider Note   CSN: [id number] Arrival date & time: 09/01/15  1131  First Provider Contact:  First MD Initiated Contact with Patient 09/01/15 1237        History   Chief Complaint Chief Complaint  Patient presents with  . Otalgia    HPI Destiny Fischer is a 35 y.o. female.  HPI   Pt is a 35 y/o female with a history of depression, fibromyalgia who presents to the ED for recheck of her right ear. Patient was first seen on 8/8 for severe right ear pain and was diagnosed with otitis media. She was placed on amoxicillin. She will return to the ED on 8/9 complaining of persistent right your pain and white drainage. At that time her TM was unable to be visualized and patient was placed on Cortisporin drops. She returns today with serosanguineous drainage. She states she is no longer having any ear pain. She states that at her last visit she was told she had a ruptured eardrum. Patient is complaining of decreased hearing in that ear since 8/8. She denies headache, fever, chills, visual changes, sore throat, nausea, vomiting.  Past Medical History:  Diagnosis Date  . Depression   . Fibromyalgia   . IBS (irritable bowel syndrome)   . Menstrual abnormality   . Migraine     There are no active problems to display for this patient.   Past Surgical History:  Procedure Laterality Date  . CESAREAN SECTION      OB History    No data available       Home Medications    Prior to Admission medications   Medication Sig Start Date End Date Taking? Authorizing Provider  amoxicillin (AMOXIL) 500 MG capsule Take 1 capsule (500 mg total) by mouth 3 (three) times daily. 08/29/15   Tammy Triplett, PA-C  Ascorbic Acid (VITAMIN C PO) Take 1 tablet by mouth daily as needed (sickness).    Historical Provider, MD  guaifenesin (ROBITUSSIN) 100 MG/5ML syrup Take 200 mg by mouth 3 (three) times daily as needed for cough.    Historical Provider, MD  HYDROcodone-acetaminophen  (NORCO/VICODIN) 5-325 MG tablet Take one tab po q 4-6 hrs prn pain 08/29/15   Tammy Triplett, PA-C  IRON PO Take 1 tablet by mouth daily.    Historical Provider, MD  MAGNESIUM PO Take 1 tablet by mouth daily.    Historical Provider, MD  meloxicam (MOBIC) 7.5 MG tablet Take 1 tablet (7.5 mg total) by mouth 2 (two) times daily after a meal. Patient not taking: Reported on 08/29/2015 04/10/15   Billy Fischer, MD  Multiple Vitamin (MULTIVITAMINS PO) Take by mouth.    Historical Provider, MD  naproxen (NAPROSYN) 500 MG tablet Take 1 tablet (500 mg total) by mouth 2 (two) times daily. Patient not taking: Reported on 08/29/2015 12/16/14   Gloriann Loan, PA-C  naproxen sodium (ANAPROX) 220 MG tablet Take 220 mg by mouth 2 (two) times daily as needed (headache).    Historical Provider, MD  neomycin-polymyxin-hydrocortisone (CORTISPORIN) 3.5-10000-1 otic suspension Place 4 drops into the right ear 3 (three) times daily. 08/30/15 09/09/15  Evalee Jefferson, PA-C  OVER THE COUNTER MEDICATION Take 2 tablets by mouth daily as needed (menstrual cycle).     Historical Provider, MD  oxyCODONE-acetaminophen (PERCOCET/ROXICET) 5-325 MG tablet Take 2 tablets by mouth every 4 (four) hours as needed for severe pain. 08/30/15   Evalee Jefferson, PA-C    Family History History reviewed. No pertinent family history.  Social History Social History  Substance Use Topics  . Smoking status: Never Smoker  . Smokeless tobacco: Never Used  . Alcohol use No     Allergies   Other   Review of Systems Review of Systems  Constitutional: Negative for chills and fever.  HENT: Positive for ear discharge and hearing loss. Negative for congestion, ear pain, facial swelling, sore throat and trouble swallowing.   Respiratory: Negative for cough and shortness of breath.   Gastrointestinal: Negative for nausea and vomiting.  Neurological: Negative for headaches.     Physical Exam Updated Vital Signs BP 119/87 (BP Location: Left Arm)   Pulse 81    Temp 100.1 F (37.8 C) (Oral)   Resp 18   Ht 5\' 2"  (1.575 m)   Wt 85.7 kg   LMP 08/15/2015   SpO2 98%   BMI 34.57 kg/m   Physical Exam  Constitutional: She appears well-developed and well-nourished. No distress.  HENT:  Head: Normocephalic and atraumatic.  Right Ear: There is drainage and swelling. No tenderness.  Left Ear: Tympanic membrane, external ear and ear canal normal. No drainage, swelling or tenderness.  Right ear canal with swelling and serosanguineous drainage, canal appears irritated and TM not completely visualized. Posterior aspect of TM without erythema, it is white, no blood noted and no rupture noted.   Eyes: Conjunctivae are normal.  Pulmonary/Chest: Effort normal. No respiratory distress.  Musculoskeletal: Normal range of motion.  Neurological: She is alert. Coordination normal.  Skin: Skin is warm and dry. She is not diaphoretic.  Psychiatric: She has a normal mood and affect. Her behavior is normal.  Nursing note and vitals reviewed.    ED Treatments / Results  Labs (all labs ordered are listed, but only abnormal results are displayed) Labs Reviewed - No data to display  EKG  EKG Interpretation None       Radiology No results found.  Procedures Procedures (including critical care time)  Medications Ordered in ED Medications - No data to display   Initial Impression / Assessment and Plan / ED Course  I have reviewed the triage vital signs and the nursing notes.  Pertinent labs & imaging results that were available during my care of the patient were reviewed by me and considered in my medical decision making (see chart for details).  Clinical Course   PT with resolving ear pain but continued decrease hearing and discharge. Pt's canal edematous. Pt on Cortisporin drops which will Adequately treat otitis externa and she is still taking amoxicillin which will cover for otitis media. Unable to completely visualize the TM due to swelling in  canal. No mastoid tenderness less concerning for mastoiditis. Instructed pt to f/u with ENT on Monday to have ear reevaluated. It is reassuring that the patient's pain is resolved. I instructed her to continue the eardrops and the amoxicillin. I instructed her to also follow up with [facility] health and wellness. Patient denies fever although temperature was 100.1 upon arrival to the ED. Patient well-appearing and not tachycardic, she is stable for discharge at this time. I discussed strict return precautions. Patient expressed understanding to the discharge instructions.  Final Clinical Impressions(s) / ED Diagnoses   Final diagnoses:  Ear drainage right    New Prescriptions New Prescriptions   No medications on file     Kalman Drape, PA 09/01/15 [place], DO 09/05/15 1639

## 2015-09-01 NOTE — Discharge Instructions (Signed)
Continue to use the ear drops as prescribed. Follow up with [facility] and Wellness to try to get a primary care provider established. Also follow up with Dr. Constance Holster, ENT, regarding your ear.   Return to the emergency department if you experience worsening ear pain, worsening drainage, fever, chills, headache, swelling behind your ear or any other concerning symptoms.

## 2015-09-01 NOTE — ED Notes (Signed)
This is pts third visit for right ear problem.  Denies any pain today, says pain is controlled with last visit.  Here today for blood tingled drainage from right ear and concerned that she may not be complete dose of ear drop because the medication rolls back out of ear.  Pt says she place drops in right ear and keep medication in for about 20 minutes by medication rolls out once she began ADL's  Pt do have samples of blood on tissue (in baggy) of drainage from ear.  This is serosanguineous drainage noted on tissue.  No active bleed noted and no c/o pain at this time.

## 2016-05-29 DIAGNOSIS — T7840XA Allergy, unspecified, initial encounter: Secondary | ICD-10-CM | POA: Diagnosis present

## 2016-05-29 DIAGNOSIS — L539 Erythematous condition, unspecified: Secondary | ICD-10-CM | POA: Diagnosis not present

## 2016-05-29 DIAGNOSIS — Z79899 Other long term (current) drug therapy: Secondary | ICD-10-CM | POA: Diagnosis not present

## 2016-05-30 ENCOUNTER — Emergency Department (HOSPITAL_COMMUNITY)
Admission: EM | Admit: 2016-05-30 | Discharge: 2016-05-30 | Disposition: A | Discharge status: Home / Self Care | Payer: Medicare Other | Attending: Emergency Medicine | Admitting: Emergency Medicine

## 2016-05-30 ENCOUNTER — Encounter (HOSPITAL_COMMUNITY): Payer: Self-pay

## 2016-05-30 DIAGNOSIS — L539 Erythematous condition, unspecified: Secondary | ICD-10-CM

## 2016-05-30 HISTORY — DX: Raynaud's syndrome without gangrene: I73.00

## 2016-05-30 HISTORY — DX: Dry mouth, unspecified: R68.2

## 2016-05-30 NOTE — ED Provider Notes (Signed)
[facility] DEPT Provider Note   CSN: [id number] Arrival date & time: 05/29/16  2343     History   Chief Complaint Chief Complaint  Patient presents with  . Allergic Reaction    HPI Destiny Fischer is a 36 y.o. female.  HPI  36 year old female with a history of fibromyalgia, Sjogren syndrome, and raynaud's presents with bilateral hand burning, itching and redness since waking up on 5/9. Has tried 25 mg benadryl twice with partial relief. No allergens. No cough, wheezing, dyspnea. Never happened to her before. Hands feel warm, and placing them in cool water or ice helps.   Past Medical History:  Diagnosis Date  . Depression   . Dry mouth   . Fibromyalgia   . IBS (irritable bowel syndrome)   . Menstrual abnormality   . Migraine   . Raynaud's phenomenon     There are no active problems to display for this patient.   Past Surgical History:  Procedure Laterality Date  . CESAREAN SECTION      OB History    No data available       Home Medications    Prior to Admission medications   Medication Sig Start Date End Date Taking? Authorizing Provider  amoxicillin (AMOXIL) 500 MG capsule Take 1 capsule (500 mg total) by mouth 3 (three) times daily. 08/29/15   Triplett, Tammy, PA-C  Ascorbic Acid (VITAMIN C PO) Take 1 tablet by mouth daily as needed (sickness).    [provider]  guaifenesin (ROBITUSSIN) 100 MG/5ML syrup Take 200 mg by mouth 3 (three) times daily as needed for cough.    [provider]  HYDROcodone-acetaminophen (NORCO/VICODIN) 5-325 MG tablet Take one tab po q 4-6 hrs prn pain 08/29/15   Triplett, Tammy, PA-C  IRON PO Take 1 tablet by mouth daily.    [provider]  MAGNESIUM PO Take 1 tablet by mouth daily.    [provider]  Multiple Vitamin (MULTIVITAMINS PO) Take by mouth.    [provider]  naproxen sodium (ANAPROX) 220 MG tablet Take 220 mg by mouth 2 (two) times daily as needed (headache).    [provider]  OVER THE COUNTER MEDICATION Take 2 tablets by mouth daily as needed (menstrual cycle).     [provider]  oxyCODONE-acetaminophen (PERCOCET/ROXICET) 5-325 MG tablet Take 2 tablets by mouth every 4 (four) hours as needed for severe pain. 08/30/15   Evalee Jefferson, PA-C    Family History No family history on file.  Social History Social History  Substance Use Topics  . Smoking status: Never Smoker  . Smokeless tobacco: Never Used  . Alcohol use No     Allergies   Other   Review of Systems Review of Systems  Constitutional: Negative for fever.  Respiratory: Negative for cough, shortness of breath and wheezing.   Skin: Positive for color change. Negative for wound.  Neurological: Negative for numbness.  All other systems reviewed and are negative.    Physical Exam Updated Vital Signs BP (!) 119/91 (BP Location: Right Arm)   Pulse 85   Temp 98.3 F (36.8 C) (Oral)   Resp 16   Ht 5\' 2"  (1.575 m)   Wt 192 lb (87.1 kg)   LMP 05/07/2016 (Approximate)   SpO2 98%   BMI 35.12 kg/m   Physical Exam  Constitutional: She is oriented to person, place, and time. She appears well-developed and well-nourished.  HENT:  Head: Normocephalic and atraumatic.  Right Ear:  External ear normal.  Left Ear: External ear normal.  Nose: Nose normal.  Eyes: Right eye exhibits no discharge. Left eye exhibits no discharge.  Cardiovascular: Normal rate, regular rhythm and normal heart sounds.   Pulses:      Radial pulses are 2+ on the right side, and 2+ on the left side.  Pulmonary/Chest: Effort normal and breath sounds normal. No stridor. She has no wheezes.  Abdominal: She exhibits no distension.  Neurological: She is alert and oriented to person, place, and time.  Skin: Skin is warm and dry.  Mild erythema/pink skin, most prominent to bilateral dorsal hands extending to wrists. No focal swelling, mild swelling noted left hand worse than right. Mild warmth.   Nursing  note and vitals reviewed.    ED Treatments / Results  Labs (all labs ordered are listed, but only abnormal results are displayed) Labs Reviewed - No data to display  EKG  EKG Interpretation None       Radiology No results found.  Procedures Procedures (including critical care time)  Medications Ordered in ED Medications - No data to display   Initial Impression / Assessment and Plan / ED Course  I have reviewed the triage vital signs and the nursing notes.  Pertinent labs & imaging results that were available during my care of the patient were reviewed by me and considered in my medical decision making (see chart for details).     Presentation is most c/w erythroderma, especially given how much cool water helps. Continue benadryl (up to 50 mg at a time based on weight). Needs a PCP for further outpatient workup. No signs of systemic allergic reaction  Final Clinical Impressions(s) / ED Diagnoses   Final diagnoses:  Erythroderma    New Prescriptions Discharge Medication List as of 05/30/2016  2:25 AM       Sherwood Gambler, MD 05/30/16 1003

## 2016-05-30 NOTE — ED Triage Notes (Signed)
Pt reports waking up this morning with itching and burning to both hands. Reports taking Benadryl with no relief. No other symptoms reported.

## 2016-07-09 DIAGNOSIS — Z79899 Other long term (current) drug therapy: Secondary | ICD-10-CM | POA: Diagnosis not present

## 2016-07-09 DIAGNOSIS — F410 Panic disorder [episodic paroxysmal anxiety] without agoraphobia: Secondary | ICD-10-CM | POA: Diagnosis not present

## 2017-02-14 DIAGNOSIS — J209 Acute bronchitis, unspecified: Secondary | ICD-10-CM | POA: Diagnosis not present

## 2017-04-21 DIAGNOSIS — N924 Excessive bleeding in the premenopausal period: Secondary | ICD-10-CM | POA: Diagnosis not present

## 2017-04-21 DIAGNOSIS — E875 Hyperkalemia: Secondary | ICD-10-CM | POA: Diagnosis not present

## 2017-04-21 DIAGNOSIS — G43009 Migraine without aura, not intractable, without status migrainosus: Secondary | ICD-10-CM | POA: Diagnosis not present

## 2017-04-21 DIAGNOSIS — K219 Gastro-esophageal reflux disease without esophagitis: Secondary | ICD-10-CM | POA: Diagnosis not present

## 2017-04-21 DIAGNOSIS — E559 Vitamin D deficiency, unspecified: Secondary | ICD-10-CM | POA: Diagnosis not present

## 2017-04-21 DIAGNOSIS — M6281 Muscle weakness (generalized): Secondary | ICD-10-CM | POA: Diagnosis not present

## 2017-04-21 DIAGNOSIS — E6609 Other obesity due to excess calories: Secondary | ICD-10-CM | POA: Diagnosis not present

## 2017-04-21 DIAGNOSIS — E531 Pyridoxine deficiency: Secondary | ICD-10-CM | POA: Diagnosis not present

## 2017-05-13 DIAGNOSIS — Z5321 Procedure and treatment not carried out due to patient leaving prior to being seen by health care provider: Secondary | ICD-10-CM | POA: Diagnosis not present

## 2017-05-29 ENCOUNTER — Other Ambulatory Visit: Payer: Self-pay

## 2017-05-29 ENCOUNTER — Ambulatory Visit (INDEPENDENT_AMBULATORY_CARE_PROVIDER_SITE_OTHER): Payer: Medicare Other | Admitting: Adult Health

## 2017-05-29 ENCOUNTER — Encounter (INDEPENDENT_AMBULATORY_CARE_PROVIDER_SITE_OTHER): Payer: Self-pay

## 2017-05-29 ENCOUNTER — Encounter: Payer: Self-pay | Admitting: Adult Health

## 2017-05-29 VITALS — BP 118/76 | HR 89 | Ht 63.0 in | Wt 220.0 lb

## 2017-05-29 DIAGNOSIS — F329 Major depressive disorder, single episode, unspecified: Secondary | ICD-10-CM | POA: Diagnosis not present

## 2017-05-29 DIAGNOSIS — F32A Depression, unspecified: Secondary | ICD-10-CM

## 2017-05-29 DIAGNOSIS — R1011 Right upper quadrant pain: Secondary | ICD-10-CM

## 2017-05-29 DIAGNOSIS — N921 Excessive and frequent menstruation with irregular cycle: Secondary | ICD-10-CM

## 2017-05-29 DIAGNOSIS — N946 Dysmenorrhea, unspecified: Secondary | ICD-10-CM | POA: Diagnosis not present

## 2017-05-29 NOTE — Progress Notes (Signed)
Patient ID: Destiny Fischer, female   DOB: Jan 19, 1981, 37 y.o.   MRN: [id number] History of Present Illness: Destiny Fischer is a 37 year old white female in complaining of heavy periods since age 60 and wants [place] to check liver, has ?hemanigoma. She is separated and has boyfriend,39 y.o. she does not have her 2 kids she said they were taken from her. She says her periods last about 7-8 days and are heavy the first 3, changes pads every 2 hours and has cramps and clots.She says she has pain RUQ and ingestion at times. She had tubal with last C section and rarely has sex.She has been seen at [facility] and had labs, She says she had bad experience at age37 with speculum exam. She is very talkative and all over the place with her conversation.She says she is "suing the state for taking her kids and ruining her reputation and her marriage and her identity". Her boyfriend is a Administrator and she used to ride with him but is staying with his mom now.   Current Medications, Allergies, Past Medical History, Past Surgical History, Family History and Social History were reviewed in [employer] record.     Review of Systems: Patient denies any headaches, hearing loss, fatigue, blurred vision, shortness of breath, chest pain,  problems with bowel movements, urination. No joint pain or mood swings.See HPI for positives.    Physical Exam:BP 118/76 (BP Location: Left Arm, Patient Position: Sitting, Cuff Size: Large)   Pulse 89   Ht 5\' 3"  (1.6 m)   Wt 220 lb (99.8 kg)   BMI 38.97 kg/m  General:  Well developed, well nourished, no acute distress Skin:  Warm and dry Lungs; Clear to auscultation bilaterally Cardiovascular: Regular rate and rhythm Abdomen:  Soft, no hepatosplenomegaly,diffuse tenderness  Pelvic:  External genitalia is normal in appearance, no lesions.  The vagina is normal in appearance. Urethra has no lesions or masses. The cervix is smooth, no CMT,declines STD testing. Uterus is  felt to be normal size, shape, and contour, and tender to palpation.  No adnexal masses +tenderness noted.Bladder is non tender, no masses felt. Extremities/musculoskeletal:  No swelling or varicosities noted, no clubbing or cyanosis Psych:  No mood changes, alert and cooperative, PHQ 9 score 15, denies being suicidal and declines meds, has seen counselor in past. Destiny Nevin, RN was in attendance during exam.   Impression: 1. RUQ pain   2. Menorrhagia with irregular cycle   3. Dysmenorrhea   4. Depression, unspecified depression type       Plan: Get labs for review from [facility]  Abdominal and pelvic US 5/21 at 8:30 at [facility] request could not go next week) Follow up in 3 weeks   Review handout on ablation

## 2017-06-10 ENCOUNTER — Ambulatory Visit (HOSPITAL_COMMUNITY)
Admission: RE | Admit: 2017-06-10 | Discharge: 2017-06-10 | Disposition: A | Discharge status: Home / Self Care | Payer: Medicare Other | Source: Ambulatory Visit | Attending: Adult Health | Admitting: Adult Health

## 2017-06-10 ENCOUNTER — Telehealth: Payer: Self-pay | Admitting: Adult Health

## 2017-06-10 DIAGNOSIS — N921 Excessive and frequent menstruation with irregular cycle: Secondary | ICD-10-CM | POA: Insufficient documentation

## 2017-06-10 DIAGNOSIS — N946 Dysmenorrhea, unspecified: Secondary | ICD-10-CM | POA: Diagnosis not present

## 2017-06-10 DIAGNOSIS — R1011 Right upper quadrant pain: Secondary | ICD-10-CM

## 2017-06-10 DIAGNOSIS — K824 Cholesterolosis of gallbladder: Secondary | ICD-10-CM | POA: Diagnosis not present

## 2017-06-10 DIAGNOSIS — N920 Excessive and frequent menstruation with regular cycle: Secondary | ICD-10-CM | POA: Diagnosis not present

## 2017-06-10 NOTE — Telephone Encounter (Signed)
Patient called our after hours nurse stating that she had an Ultrasound done earlier today at [facility] and would like for [facility] to give her a call back with the results. Please contact pt

## 2017-06-10 NOTE — Telephone Encounter (Signed)
Pt aware pelvic US was limited but appears normal and abdominal US showed small 4 mm gallbladder polyp and 13 x 12 mm hemangioma in liver and fatty liver.Keep appt next week for further discussion on controlling periods and consider GI referral

## 2017-06-18 ENCOUNTER — Telehealth: Payer: Self-pay | Admitting: Adult Health

## 2017-06-18 ENCOUNTER — Ambulatory Visit (INDEPENDENT_AMBULATORY_CARE_PROVIDER_SITE_OTHER): Payer: Medicare Other | Admitting: Adult Health

## 2017-06-18 ENCOUNTER — Encounter: Payer: Self-pay | Admitting: Adult Health

## 2017-06-18 VITALS — BP 112/88 | HR 92 | Ht 62.0 in | Wt 216.0 lb

## 2017-06-18 DIAGNOSIS — N946 Dysmenorrhea, unspecified: Secondary | ICD-10-CM | POA: Diagnosis not present

## 2017-06-18 DIAGNOSIS — K824 Cholesterolosis of gallbladder: Secondary | ICD-10-CM | POA: Insufficient documentation

## 2017-06-18 DIAGNOSIS — K760 Fatty (change of) liver, not elsewhere classified: Secondary | ICD-10-CM | POA: Diagnosis not present

## 2017-06-18 DIAGNOSIS — R1011 Right upper quadrant pain: Secondary | ICD-10-CM | POA: Insufficient documentation

## 2017-06-18 DIAGNOSIS — N921 Excessive and frequent menstruation with irregular cycle: Secondary | ICD-10-CM | POA: Diagnosis not present

## 2017-06-18 DIAGNOSIS — D1803 Hemangioma of intra-abdominal structures: Secondary | ICD-10-CM

## 2017-06-18 NOTE — Progress Notes (Signed)
  Subjective:     Patient ID: Destiny Fischer, female   DOB: Sep 17, 1980, 37 y.o.   MRN: [id number]  HPI Destiny Fischer is a 37 year old black female, back in follow up, of RUQ pain and heavy periods.  Review of Systems RUQ pain +increase in mucous Heavy periods and painful, sp tubal  Reviewed past medical,surgical, social and family history. Reviewed medications and allergies.     Objective:   Physical Exam BP 112/88 (BP Location: Left Arm, Patient Position: Sitting, Cuff Size: Large)   Pulse 92   Ht 5\' 2"  (1.575 m)   Wt 216 lb (98 kg)   BMI 39.51 kg/m   Talk only; She can't decide if she wants ablation or not, depo and IUD an option too. Will refer to Dr Oneida Alar to evaluate, fatty liver, gallbladder polyp and hemangioma of liver.      Assessment:     1. RUQ pain   2. Menorrhagia with irregular cycle   3. Dysmenorrhea   4. Fatty liver   5. Gallbladder polyp   6. Hemangioma of liver       Plan:     Referred to Dr Oneida Alar F/U prn Call if decides she wants ablation

## 2017-06-18 NOTE — Telephone Encounter (Signed)
Informed patient that there may may not be another GI offices that can get her in sooner for referral.  Patient states she is going to call around other offices and get back to me if she can get in sooner somewhere else.

## 2017-06-18 NOTE — Telephone Encounter (Signed)
Patient called stating that she came into the office tocreseday to see Anderson Malta and she had referred her to Dr. Oneida Alar of for her problem. Patient states that she called Dr. Oneida Alar office and they could schedule her 3 months out. Pt states that her pain is to severe and that she needs this fix as soon as possible. Pt also states that the ER wont help her. Pt would like Anderson Malta to send referral to another [facility] that will see her sooner. Please contact pt

## 2017-06-19 ENCOUNTER — Encounter: Payer: Self-pay | Admitting: Gastroenterology

## 2017-09-18 ENCOUNTER — Encounter: Payer: Self-pay | Admitting: Gastroenterology

## 2017-09-18 ENCOUNTER — Telehealth: Payer: Self-pay | Admitting: Gastroenterology

## 2017-09-18 ENCOUNTER — Ambulatory Visit: Payer: Medicaid Other | Admitting: Gastroenterology

## 2017-09-18 NOTE — Telephone Encounter (Signed)
PATIENT WAS A NO SHOW AND LETTER SENT  °

## 2017-09-18 NOTE — Telephone Encounter (Signed)
REVIEWED-NO ADDITIONAL RECOMMENDATIONS. 

## 2017-09-30 ENCOUNTER — Telehealth: Payer: Self-pay | Admitting: *Deleted

## 2017-09-30 NOTE — Telephone Encounter (Signed)
I called pt to inform her of her appt this Friday at [facility].  Pt started crying saying that she can't come this Friday.  Pt says that she has alot going on in her life right now and can't commit to an appt.  I informed pt that we would be cancelling the referral and informing Marylee Floras.   Manuela Schwartz at [facility] informed. 09-30-17  AS

## 2017-10-03 ENCOUNTER — Ambulatory Visit: Payer: Medicare Other | Admitting: Gastroenterology

## 2017-11-05 ENCOUNTER — Ambulatory Visit: Payer: Self-pay

## 2017-11-05 NOTE — Telephone Encounter (Signed)
Patient called in with c/o "waking up gasping for air." She says "I think I have sleep apnea, which has been going on for about a year, but here lately is getting worse and scary. I have Fibromyalgia and that affects my sleep, but I pretty much have the pain under control. I go to sleep and find myself waking up choking and gasping for air, coughing so hard my chest hurts. I do have stress in my life for the last 3 years having my children taken away, becoming homeless, my husband walking away from my marriage, a whole lot of things. But, I am getting back on track." I asked about other symptoms, she says "I have difficulty breathing mainly at night." According to protocol, see PCP within 2 weeks, appointment scheduled for tomorrow at 0900 with Jodi Mourning, FNP who patient has a new patient appointment in January, 2020. Care advice given, patient verbalized understanding.   Reason for Disposition . Loud snoring is an ongoing problem  (> 2 weeks)  Answer Assessment - Initial Assessment Questions 1. DESCRIPTION: "Tell me about your sleeping problem."      Wake up choking for air, coughing hard 2. ONSET: "How long have you been having trouble sleeping?" (e.g., days, weeks, months)     A year, but getting worse 3. RECURRENT: "Have you had sleeping problems before?"  If yes: "What happened that time?" "What helped your sleeping problem go away in the past?"      Fibromyalgia sleeping problems, but nothing like waking up choking for air 4. STRESS: "Is there anything in your life that is making you feel stressed or tense?"     Yes, for the last 3 years my life has turned upside down, traumatic changes 5. PAIN: "Do you have any pain that is keeping you awake?" (e.g., back pain, headache, abdominal pain)     Fibromyalgia does sometimes 6. CAFFEINE ABUSE: "Do you drink caffeinated beverages, and how much each day?" (e.g., coffee, tea, colas)     Very rarely 7. SUBSTANCE ABUSE: "Do you use any illegal drugs  or alcohol?"     Alcohol socially, no illegal drugs 8. OTHER SYMPTOMS: "Do you have any other symptoms?"  (e.g., difficulty breathing)     Difficulty breathing at night when sleeping  Protocols used: INSOMNIA-A-AH

## 2017-11-06 ENCOUNTER — Ambulatory Visit (INDEPENDENT_AMBULATORY_CARE_PROVIDER_SITE_OTHER)
Admission: RE | Admit: 2017-11-06 | Discharge: 2017-11-06 | Disposition: A | Discharge status: Home / Self Care | Payer: Medicare Other | Source: Ambulatory Visit | Attending: Family | Admitting: Family

## 2017-11-06 ENCOUNTER — Other Ambulatory Visit (INDEPENDENT_AMBULATORY_CARE_PROVIDER_SITE_OTHER): Payer: Medicare Other

## 2017-11-06 ENCOUNTER — Encounter: Payer: Self-pay | Admitting: Family

## 2017-11-06 ENCOUNTER — Ambulatory Visit (INDEPENDENT_AMBULATORY_CARE_PROVIDER_SITE_OTHER): Payer: Medicare Other | Admitting: Family

## 2017-11-06 VITALS — BP 110/88 | HR 92 | Temp 98.6°F | Ht 62.0 in | Wt 216.1 lb

## 2017-11-06 DIAGNOSIS — R7309 Other abnormal glucose: Secondary | ICD-10-CM

## 2017-11-06 DIAGNOSIS — R0602 Shortness of breath: Secondary | ICD-10-CM

## 2017-11-06 DIAGNOSIS — R5383 Other fatigue: Secondary | ICD-10-CM | POA: Diagnosis not present

## 2017-11-06 DIAGNOSIS — G4730 Sleep apnea, unspecified: Secondary | ICD-10-CM

## 2017-11-06 LAB — COMPREHENSIVE METABOLIC PANEL
ALK PHOS: 82 U/L (ref 39–117)
ALT: 9 U/L (ref 0–35)
AST: 9 U/L (ref 0–37)
Albumin: 4.4 g/dL (ref 3.5–5.2)
BILIRUBIN TOTAL: 0.3 mg/dL (ref 0.2–1.2)
BUN: 11 mg/dL (ref 6–23)
CO2: 26 mEq/L (ref 19–32)
CREATININE: 0.82 mg/dL (ref 0.40–1.20)
Calcium: 9.5 mg/dL (ref 8.4–10.5)
Chloride: 105 mEq/L (ref 96–112)
GFR: 82.17 mL/min (ref >60.00)
GLUCOSE: 113 mg/dL — AB (ref 70–99)
Potassium: 4.1 mEq/L (ref 3.5–5.1)
SODIUM: 139 meq/L (ref 135–145)
TOTAL PROTEIN: 7.8 g/dL (ref 6.0–8.3)

## 2017-11-06 LAB — CBC WITH DIFFERENTIAL/PLATELET
Basophils Absolute: 0 10*3/uL (ref 0.0–0.1)
Basophils Relative: 0.5 % (ref 0.0–3.0)
EOS ABS: 0.1 10*3/uL (ref 0.0–0.7)
Eosinophils Relative: 1.5 % (ref 0.0–5.0)
HCT: 35 % — ABNORMAL LOW (ref 36.0–46.0)
Hemoglobin: 11.1 g/dL — ABNORMAL LOW (ref 12.0–15.0)
LYMPHS ABS: 1.8 10*3/uL (ref 0.7–4.0)
Lymphocytes Relative: 21.5 % (ref 12.0–46.0)
MCHC: 31.6 g/dL (ref 30.0–36.0)
MCV: 75.4 fl — ABNORMAL LOW (ref 78.0–100.0)
MONO ABS: 0.5 10*3/uL (ref 0.1–1.0)
Monocytes Relative: 5.3 % (ref 3.0–12.0)
NEUTROS PCT: 71.2 % (ref 43.0–77.0)
Neutro Abs: 6.1 10*3/uL (ref 1.4–7.7)
Platelets: 430 10*3/uL — ABNORMAL HIGH (ref 150.0–400.0)
RBC: 4.64 Mil/uL (ref 3.87–5.11)
RDW: 17.4 % — ABNORMAL HIGH (ref 11.5–15.5)
WBC: 8.5 10*3/uL (ref 4.0–10.5)

## 2017-11-06 LAB — HEMOGLOBIN A1C: HEMOGLOBIN A1C: 5.6 % (ref 4.6–6.5)

## 2017-11-06 LAB — TSH: TSH: 1.6 u[IU]/mL (ref 0.35–4.50)
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
## 2017-11-10 ENCOUNTER — Telehealth: Payer: Self-pay | Admitting: Family

## 2017-11-10 NOTE — Telephone Encounter (Signed)
error 

## 2017-11-20 ENCOUNTER — Telehealth: Payer: Self-pay | Admitting: Obstetrics & Gynecology

## 2017-11-20 NOTE — Telephone Encounter (Signed)
Unable to leave voicemail.

## 2017-11-20 NOTE — Telephone Encounter (Signed)
Informed patient we did not send the referral. Patient states that we did. I informed Jodi Mourning with [facility] sent the referral to [facility].  Patient states "that is you".  I informed the patient that we were [facility] but will have the provider review her chart again and call her back. Pt verbalized understanding.

## 2017-11-20 NOTE — Telephone Encounter (Signed)
Patient presented to the office and stated that we referred her for a sleep study, but they cancelled her appointment and sent her to [facility] [place].  Behavioral Health is needing a referral from [place].  [phone]

## 2017-11-21 NOTE — Telephone Encounter (Signed)
Pt aware she saw Jodi Mourning in October, call her back

## 2017-12-06 DIAGNOSIS — H6693 Otitis media, unspecified, bilateral: Secondary | ICD-10-CM | POA: Diagnosis not present

## 2017-12-06 DIAGNOSIS — F419 Anxiety disorder, unspecified: Secondary | ICD-10-CM | POA: Diagnosis not present

## 2017-12-06 DIAGNOSIS — F4310 Post-traumatic stress disorder, unspecified: Secondary | ICD-10-CM | POA: Diagnosis not present

## 2017-12-06 DIAGNOSIS — F329 Major depressive disorder, single episode, unspecified: Secondary | ICD-10-CM | POA: Diagnosis not present

## 2017-12-29 ENCOUNTER — Encounter

## 2017-12-29 ENCOUNTER — Institutional Professional Consult (permissible substitution): Payer: Medicare Other | Admitting: Neurology

## 2018-01-12 DIAGNOSIS — K219 Gastro-esophageal reflux disease without esophagitis: Secondary | ICD-10-CM | POA: Diagnosis not present

## 2018-01-12 DIAGNOSIS — F329 Major depressive disorder, single episode, unspecified: Secondary | ICD-10-CM | POA: Diagnosis not present

## 2018-01-12 DIAGNOSIS — H9042 Sensorineural hearing loss, unilateral, left ear, with unrestricted hearing on the contralateral side: Secondary | ICD-10-CM | POA: Diagnosis not present

## 2018-01-12 DIAGNOSIS — H9190 Unspecified hearing loss, unspecified ear: Secondary | ICD-10-CM | POA: Diagnosis not present

## 2018-01-12 DIAGNOSIS — M797 Fibromyalgia: Secondary | ICD-10-CM | POA: Diagnosis not present

## 2018-01-12 DIAGNOSIS — F419 Anxiety disorder, unspecified: Secondary | ICD-10-CM | POA: Diagnosis not present

## 2018-01-12 DIAGNOSIS — G43909 Migraine, unspecified, not intractable, without status migrainosus: Secondary | ICD-10-CM | POA: Diagnosis not present

## 2018-01-12 DIAGNOSIS — L299 Pruritus, unspecified: Secondary | ICD-10-CM | POA: Diagnosis not present

## 2018-01-12 DIAGNOSIS — R0989 Other specified symptoms and signs involving the circulatory and respiratory systems: Secondary | ICD-10-CM | POA: Diagnosis not present

## 2018-02-17 ENCOUNTER — Ambulatory Visit: Payer: Medicare Other | Admitting: Family

## 2018-02-17 ENCOUNTER — Encounter

## 2018-03-16 DIAGNOSIS — G43909 Migraine, unspecified, not intractable, without status migrainosus: Secondary | ICD-10-CM | POA: Diagnosis not present

## 2018-03-16 DIAGNOSIS — B353 Tinea pedis: Secondary | ICD-10-CM | POA: Diagnosis not present

## 2018-03-19 DIAGNOSIS — F331 Major depressive disorder, recurrent, moderate: Secondary | ICD-10-CM | POA: Diagnosis not present

## 2018-03-19 DIAGNOSIS — Z79899 Other long term (current) drug therapy: Secondary | ICD-10-CM | POA: Diagnosis not present

## 2018-03-19 DIAGNOSIS — F21 Schizotypal disorder: Secondary | ICD-10-CM | POA: Diagnosis not present

## 2018-05-08 ENCOUNTER — Ambulatory Visit: Payer: Medicare Other | Admitting: Diagnostic Neuroimaging

## 2019-05-18 ENCOUNTER — Encounter: Payer: Self-pay | Admitting: Nurse Practitioner

## 2019-05-18 ENCOUNTER — Other Ambulatory Visit: Payer: Self-pay

## 2019-05-18 ENCOUNTER — Ambulatory Visit (INDEPENDENT_AMBULATORY_CARE_PROVIDER_SITE_OTHER): Payer: Medicare Other | Admitting: Nurse Practitioner

## 2019-05-18 VITALS — BP 108/72 | HR 75 | Temp 99.7°F | Resp 18 | Ht 63.0 in | Wt 218.2 lb

## 2019-05-18 DIAGNOSIS — Z7689 Persons encountering health services in other specified circumstances: Secondary | ICD-10-CM

## 2019-05-18 DIAGNOSIS — Z8739 Personal history of other diseases of the musculoskeletal system and connective tissue: Secondary | ICD-10-CM

## 2019-05-18 DIAGNOSIS — K760 Fatty (change of) liver, not elsewhere classified: Secondary | ICD-10-CM

## 2019-05-18 DIAGNOSIS — Z13228 Encounter for screening for other metabolic disorders: Secondary | ICD-10-CM

## 2019-05-18 DIAGNOSIS — Z13220 Encounter for screening for lipoid disorders: Secondary | ICD-10-CM | POA: Diagnosis not present

## 2019-05-18 DIAGNOSIS — L659 Nonscarring hair loss, unspecified: Secondary | ICD-10-CM | POA: Diagnosis not present

## 2019-05-18 DIAGNOSIS — K219 Gastro-esophageal reflux disease without esophagitis: Secondary | ICD-10-CM

## 2019-05-18 DIAGNOSIS — J310 Chronic rhinitis: Secondary | ICD-10-CM

## 2019-05-18 MED ORDER — FLUTICASONE PROPIONATE 50 MCG/ACT NA SUSP: 2.0000 | Freq: Every day | NASAL | 6 refills | Status: DC

## 2019-05-18 MED ORDER — FAMOTIDINE 20 MG PO TABS: 20.0000 mg | ORAL_TABLET | Freq: Every day | ORAL | 2 refills | Status: DC

## 2019-05-18 MED ORDER — OMEPRAZOLE 40 MG PO CPDR: 40.0000 mg | DELAYED_RELEASE_CAPSULE | Freq: Every day | ORAL | 2 refills | Status: DC

## 2019-05-18 MED ORDER — LORATADINE 10 MG PO TABS: 10.0000 mg | ORAL_TABLET | Freq: Every day | ORAL | 11 refills | Status: DC
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
## 2019-05-19 ENCOUNTER — Other Ambulatory Visit: Payer: Self-pay | Admitting: Nurse Practitioner

## 2019-05-19 DIAGNOSIS — D509 Iron deficiency anemia, unspecified: Secondary | ICD-10-CM

## 2019-05-19 DIAGNOSIS — E785 Hyperlipidemia, unspecified: Secondary | ICD-10-CM

## 2019-05-19 LAB — CBC WITH DIFFERENTIAL/PLATELET
Absolute Monocytes: 395 cells/uL (ref 200–950)
Basophils Absolute: 38 cells/uL (ref 0–200)
Basophils Relative: 0.5 %
Eosinophils Absolute: 198 cells/uL (ref 15–500)
Eosinophils Relative: 2.6 %
HCT: 34.8 % — ABNORMAL LOW (ref 35.0–45.0)
Hemoglobin: 10.7 g/dL — ABNORMAL LOW (ref 11.7–15.5)
Lymphs Abs: 2075 cells/uL (ref 850–3900)
MCH: 24.3 pg — ABNORMAL LOW (ref 27.0–33.0)
MCHC: 30.7 g/dL — ABNORMAL LOW (ref 32.0–36.0)
MCV: 78.9 fL — ABNORMAL LOW (ref 80.0–100.0)
MPV: 9.8 fL (ref 7.5–12.5)
Monocytes Relative: 5.2 %
Neutro Abs: 4894 cells/uL (ref 1500–7800)
Neutrophils Relative %: 64.4 %
Platelets: 434 10*3/uL — ABNORMAL HIGH (ref 140–400)
RBC: 4.41 10*6/uL (ref 3.80–5.10)
RDW: 16.5 % — ABNORMAL HIGH (ref 11.0–15.0)
Total Lymphocyte: 27.3 %
WBC: 7.6 10*3/uL (ref 3.8–10.8)

## 2019-05-19 LAB — COMPLETE METABOLIC PANEL WITH GFR
AG Ratio: 1.6 (calc) (ref 1.0–2.5)
ALT: 8 U/L (ref 6–29)
AST: 13 U/L (ref 10–30)
Albumin: 4.3 g/dL (ref 3.6–5.1)
Alkaline phosphatase (APISO): 77 U/L (ref 31–125)
BUN: 13 mg/dL (ref 7–25)
CO2: 27 mmol/L (ref 20–32)
Calcium: 9.6 mg/dL (ref 8.6–10.2)
Chloride: 104 mmol/L (ref 98–110)
Creat: 0.75 mg/dL (ref 0.50–1.10)
GFR, Est African American: 115 mL/min/{1.73_m2} (ref >=60)
GFR, Est Non African American: 99 mL/min/{1.73_m2} (ref >=60)
Globulin: 2.7 g/dL (calc) (ref 1.9–3.7)
Glucose, Bld: 92 mg/dL (ref 65–99)
Potassium: 4.2 mmol/L (ref 3.5–5.3)
Sodium: 140 mmol/L (ref 135–146)
Total Bilirubin: 0.4 mg/dL (ref 0.2–1.2)
Total Protein: 7 g/dL (ref 6.1–8.1)

## 2019-05-19 LAB — LIPID PANEL
Cholesterol: 206 mg/dL — ABNORMAL HIGH (ref <200)
HDL: 51 mg/dL (ref >=50)
LDL Cholesterol (Calc): 127 mg/dL (calc) — ABNORMAL HIGH
Non-HDL Cholesterol (Calc): 155 mg/dL (calc) — ABNORMAL HIGH (ref <130)
Total CHOL/HDL Ratio: 4 (calc) (ref <5.0)
Triglycerides: 164 mg/dL — ABNORMAL HIGH (ref <150)

## 2019-05-19 LAB — T4, FREE: Free T4: 1.3 ng/dL (ref 0.8–1.8)

## 2019-05-19 LAB — TSH: TSH: 0.81 mIU/L

## 2019-05-19 MED ORDER — FERROUS SULFATE 325 (65 FE) MG PO TBEC: 325.0000 mg | DELAYED_RELEASE_TABLET | Freq: Three times a day (TID) | ORAL | 3 refills | Status: DC

## 2019-05-19 NOTE — Progress Notes (Signed)
Her cholesterol is elevated. If she would like she can take action with lifestyle change: low fat/cholesterol diet, exercise 20 minute 4-5 times per week with recheck in 4 months. If she does not feel she will be able to do that then we should start her on statin.   She is anemic. She should be taking Iron. We will recheck in 4 months. Take the iron with vitamin C for better absorption and colace so that she does not get constipated. Her stools may turn dark and that is normal.  Thyroid levels normal, kidney and liver normal.

## 2019-05-19 NOTE — Progress Notes (Signed)
Referred to dietician for ida and hyperlipid diet.

## 2019-06-29 ENCOUNTER — Telehealth: Payer: Self-pay | Admitting: Nurse Practitioner

## 2019-06-29 NOTE — Telephone Encounter (Signed)
Patient called in requesting referrals advised she would need and office visit. Appointment scheduled.

## 2019-06-30 ENCOUNTER — Other Ambulatory Visit: Payer: Self-pay

## 2019-06-30 ENCOUNTER — Ambulatory Visit (INDEPENDENT_AMBULATORY_CARE_PROVIDER_SITE_OTHER): Payer: Medicare Other | Admitting: Nurse Practitioner

## 2019-06-30 VITALS — BP 116/78 | HR 98 | Temp 98.2°F | Resp 18 | Wt 218.6 lb

## 2019-06-30 DIAGNOSIS — Z8739 Personal history of other diseases of the musculoskeletal system and connective tissue: Secondary | ICD-10-CM | POA: Insufficient documentation

## 2019-06-30 DIAGNOSIS — E785 Hyperlipidemia, unspecified: Secondary | ICD-10-CM | POA: Diagnosis not present

## 2019-06-30 DIAGNOSIS — N898 Other specified noninflammatory disorders of vagina: Secondary | ICD-10-CM | POA: Diagnosis not present

## 2019-06-30 DIAGNOSIS — H04123 Dry eye syndrome of bilateral lacrimal glands: Secondary | ICD-10-CM | POA: Diagnosis not present

## 2019-06-30 DIAGNOSIS — D509 Iron deficiency anemia, unspecified: Secondary | ICD-10-CM

## 2019-06-30 NOTE — Progress Notes (Signed)
Established Patient Office Visit  Subjective:  Patient ID: Destiny Fischer, female    DOB: [birth year]  Age: 39 y.o. MRN: [id number]  CC:  Chief Complaint  Patient presents with  . Referral    find a balance for fibramalgia    HPI Destiny Fischer is a 39 year old female presenting to the clinic with symptoms of dry eyes, dry, dryness, dry vagina, anus. She reports the symptoms have been going on for years. The patient reports she drinks three bottles of other liquids or fluids. The patient has a history of fibromyalgia.  Recent labs showed normal metabolic panel. The patient was found to have iron deficiency anemia and hyperlipidemia however discouraged. Plan she desires a nutritionist referral. The patient reports she has an appointment at the end of this month with nutritionist to discuss diet management rather than medication management for her condition. She did not for discussion with provider regarding education on diet issues mania or cholesterol management.  The patient signed her referrals to ear nose and throat, GYN, and neurologist.  Patient denies chest pain, chest tightness, GU, GI symptoms other than stated, pain, shortness of breath, palpitations, or falls.  Patient stated that she could not afford $500 a month for prescription for iron, vitamin C, stool softener. Patient was notified that these medications are sold otc at approx.six dollars or less each for 51-month supply. Patient stated she does not desire to take medication with better lifestyle changes.    Past Medical History:  Diagnosis Date  . Adjustment disorder   . Anemia   . Bronchitis   . Chronic hip pain   . Depression   . Dry mouth   . Fibromyalgia   . IBS (irritable bowel syndrome)   . Menstrual abnormality   . Migraine   . Poor vision   . Raynaud's phenomenon   . Snoring     Past Surgical History:  Procedure Laterality Date  . CESAREAN SECTION    . CESAREAN SECTION WITH BILATERAL TUBAL LIGATION        Family History  Problem Relation Age of Onset  . Diabetes Maternal Grandmother   . Heart attack Maternal Grandmother   . Hypertension Mother   . Aneurysm Mother   . Cancer Maternal Aunt        brain  . Autism Son   . Mental illness Daughter     Social History   Socioeconomic History  . Marital status: Legally Separated    Spouse name: Not on file  . Number of children: Not on file  . Years of education: Not on file  . Highest education level: Not on file  Occupational History  . Not on file  Tobacco Use  . Smoking status: Never Smoker  . Smokeless tobacco: Never Used  Substance and Sexual Activity  . Alcohol use: Yes    Comment: rarely  . Drug use: No  . Sexual activity: Yes    Birth control/protection: Surgical    Comment: tubal  Other Topics Concern  . Not on file  Social History Narrative  . Not on file   Social Determinants of Health   Financial Resource Strain:   . Difficulty of Paying Living Expenses:   Food Insecurity:   . Worried About Charity fundraiser in the Last Year:   . Arboriculturist in the Last Year:   Transportation Needs:   . Film/video editor (Medical):   [name] Lack of Transportation (Non-Medical):   Physical Activity:   .  Days of Exercise per Week:   . Minutes of Exercise per Session:   Stress:   . Feeling of Stress :   Social Connections:   . Frequency of Communication with Friends and Family:   . Frequency of Social Gatherings with Friends and Family:   . Attends Religious Services:   . Active Member of Clubs or Organizations:   . Attends Archivist Meetings:   [name] Marital Status:   Intimate Partner Violence:   . Fear of Current or Ex-Partner:   . Emotionally Abused:   [name] Physically Abused:   . Sexually Abused:     Outpatient Medications Prior to Visit  Medication Sig Dispense Refill  . famotidine (PEPCID) 20 MG tablet Take 1 tablet (20 mg total) by mouth at bedtime. 30 tablet 2  . ferrous sulfate (FE TABS) 325  (65 FE) MG EC tablet Take 1 tablet (325 mg total) by mouth 3 (three) times daily with meals. 90 tablet 3  . fluticasone (FLONASE) 50 MCG/ACT nasal spray Place 2 sprays into both nostrils daily. 16 g 6  . loratadine (CLARITIN) 10 MG tablet Take 1 tablet (10 mg total) by mouth daily. 30 tablet 11  . naproxen sodium (ANAPROX) 220 MG tablet Take 220 mg by mouth 2 (two) times daily as needed (headache).    [name] omeprazole (PRILOSEC) 40 MG capsule Take 1 capsule (40 mg total) by mouth daily. 30 capsule 2  . OVER THE COUNTER MEDICATION Take 2 tablets by mouth daily as needed (menstrual cycle).     . THEANINE PO Take by mouth daily.    [name] UNABLE TO FIND Muscle milk-every other day     No facility-administered medications prior to visit.    Allergies  Allergen Reactions  . Other Anaphylaxis    peppers    ROS Review of Systems  All other systems reviewed and are negative.     Objective:    Physical Exam  Constitutional: She is oriented to person, place, and time. She appears well-developed and well-nourished.  HENT:  Head: Normocephalic.  Right Ear: Hearing normal.  Left Ear: Hearing normal.  Nose: No rhinorrhea.  Mouth/Throat: Oropharynx is clear and moist.  Eyes: Pupils are equal, round, and reactive to light. Conjunctivae, EOM and lids are normal. Lids are everted and swept, no foreign bodies found. Right conjunctiva is not injected. Left conjunctiva is not injected. Right eye exhibits normal extraocular motion and no nystagmus. Left eye exhibits normal extraocular motion and no nystagmus.  Neck: No JVD present. No thyromegaly present.  Cardiovascular: Normal rate.  Abdominal: Normal appearance.  Musculoskeletal:        General: Normal range of motion.     Cervical back: Normal range of motion.  Neurological: She is alert and oriented to person, place, and time.  Skin: Skin is warm.  Psychiatric: Her speech is normal and behavior is normal. Cognition and memory are normal.  Nursing  note and vitals reviewed.   BP 116/78 (BP Location: Left Arm, Patient Position: Sitting, Cuff Size: Large)   Pulse 98   Temp 98.2 F (36.8 C) (Temporal)   Resp 18   Wt 218 lb 9.6 oz (99.2 kg)   SpO2 97%   BMI 38.72 kg/m  Wt Readings from Last 3 Encounters:  06/30/19 218 lb 9.6 oz (99.2 kg)  05/18/19 218 lb 3.2 oz (99 kg)  11/06/17 216 lb 1.9 oz (98 kg)     Health Maintenance Due  Topic Date Due  . Hepatitis  C Screening  Never done  . COVID-19 Vaccine (1) Never done  . PAP SMEAR-Modifier  Never done    There are no preventive care reminders to display for this patient.  Lab Results  Component Value Date   TSH 0.81 05/18/2019   Lab Results  Component Value Date   WBC 7.6 05/18/2019   HGB 10.7 (L) 05/18/2019   HCT 34.8 (L) 05/18/2019   MCV 78.9 (L) 05/18/2019   PLT 434 (H) 05/18/2019   Lab Results  Component Value Date   NA 140 05/18/2019   K 4.2 05/18/2019   CO2 27 05/18/2019   GLUCOSE 92 05/18/2019   BUN 13 05/18/2019   CREATININE 0.75 05/18/2019   BILITOT 0.4 05/18/2019   ALKPHOS 82 11/06/2017   AST 13 05/18/2019   ALT 8 05/18/2019   PROT 7.0 05/18/2019   ALBUMIN 4.4 11/06/2017   CALCIUM 9.6 05/18/2019   GFR 82.17 11/06/2017   Lab Results  Component Value Date   CHOL 206 (H) 05/18/2019   Lab Results  Component Value Date   HDL 51 05/18/2019   Lab Results  Component Value Date   LDLCALC 127 (H) 05/18/2019   Lab Results  Component Value Date   TRIG 164 (H) 05/18/2019   Lab Results  Component Value Date   CHOLHDL 4.0 05/18/2019   Lab Results  Component Value Date   HGBA1C 5.6 11/06/2017      Assessment & Plan:   Problem List Items Addressed This Visit      Genitourinary   Dryness of vagina   Relevant Orders   Ambulatory referral to Gynecology     Other   Hyperlipidemia   Iron deficiency anemia   H/O fibromyalgia   Relevant Orders   Ambulatory referral to Neurology   Dry eyes - Primary   Relevant Orders   Ambulatory  referral to ENT    Follow up for repeat labs in 4 months one week prior to apt. Drink plenty of water daily Get at least 20 minutes of exercise 4-5 times per week Eat low fat, low cholesterol , low carb, low sugar diet.  Nutritionist apt as per pt at the ned of this month for IDA and Hyperlipidemia diet pt would like to avoid medications Referral to ENT for dry eyes, nose, ears Referral to neurologist for Fibromyalgia management Referral to GYN dry vagina  Eat a diet rich in Iron   No orders of the defined types were placed in this encounter.   Follow-up: Return in about 4 months (around 10/30/2019) for labs one week prior.    Annie Main, FNP

## 2019-06-30 NOTE — Patient Instructions (Addendum)
Follow up for repeat labs in 4 months one week prior to apt. Drink plenty of water daily Get at least 20 minutes of exercise 4-5 times per week Eat low fat, low cholesterol , low carb, low sugar diet.  Nutritionist apt as per pt at the ned of this month for IDA and Hyperlipidemia diet pt would like to avoid medications Referral to ENT for dry eyes, nose, ears Referral to neurologist for Fibromyalgia management Referral to GYN dry vagina  Eat a diet rich in Iron

## 2019-07-01 ENCOUNTER — Telehealth: Payer: Self-pay | Admitting: Nurse Practitioner

## 2019-07-01 NOTE — Telephone Encounter (Signed)
See note

## 2019-07-01 NOTE — Telephone Encounter (Signed)
The neurologist that [facility] sent the referral to for Fibromyalgia does not take those pts diagnoses. This is a neurological condition however on occasion psychiatry, rheumatology, and other neurologist will manage. I do know there are some great neurologist in [facility] that manage and treat this condition maybe refer outside of [facility].

## 2019-07-09 ENCOUNTER — Telehealth: Payer: Self-pay | Admitting: Adult Health

## 2019-07-09 NOTE — Telephone Encounter (Signed)
Voicemail box full, was not able to reach patient to review covid health screening questions. Will screen patient upon appointment on Monday.

## 2019-07-12 ENCOUNTER — Ambulatory Visit (INDEPENDENT_AMBULATORY_CARE_PROVIDER_SITE_OTHER): Payer: Medicare Other | Admitting: Adult Health

## 2019-07-12 ENCOUNTER — Encounter: Payer: Self-pay | Admitting: Adult Health

## 2019-07-12 VITALS — BP 121/85 | HR 97 | Ht 62.0 in | Wt 216.0 lb

## 2019-07-12 DIAGNOSIS — N760 Acute vaginitis: Secondary | ICD-10-CM

## 2019-07-12 DIAGNOSIS — B9689 Other specified bacterial agents as the cause of diseases classified elsewhere: Secondary | ICD-10-CM | POA: Insufficient documentation

## 2019-07-12 DIAGNOSIS — N649 Disorder of breast, unspecified: Secondary | ICD-10-CM

## 2019-07-12 DIAGNOSIS — N898 Other specified noninflammatory disorders of vagina: Secondary | ICD-10-CM | POA: Insufficient documentation

## 2019-07-12 DIAGNOSIS — N6320 Unspecified lump in the left breast, unspecified quadrant: Secondary | ICD-10-CM | POA: Insufficient documentation

## 2019-07-12 LAB — POCT WET PREP (WET MOUNT)
Clue Cells Wet Prep Whiff POC: POSITIVE
WBC, Wet Prep HPF POC: POSITIVE

## 2019-07-12 MED ORDER — METRONIDAZOLE 500 MG PO TABS: 500.0000 mg | ORAL_TABLET | Freq: Two times a day (BID) | ORAL | 1 refills | Status: DC

## 2019-07-12 NOTE — Progress Notes (Signed)
  Subjective:     Patient ID: Destiny Fischer, female   DOB: February 22, 1980, 39 y.o.   MRN: [id number]  HPI Destiny Fischer is a 39 year old white female, separated, in complaining of left breat mass and vaginal dryness then vaginal discharge. She does not wear a mask has PTSD.  PCP is [employer] FNP  Review of Systems Has left breast mass, getting bigger and tender Has vaginal dryness then vaginal discharge with odor and itching Denies any sexually active Reviewed past medical,surgical, social and family history. Reviewed medications and allergies.     Objective:   Physical Exam BP 121/85 (BP Location: Left Arm, Patient Position: Sitting, Cuff Size: Normal)   Pulse 97   Ht 5\' 2"  (1.575 m)   Wt 216 lb (98 kg)   BMI 39.51 kg/m   Skin warm and dry,  Breasts:no dominate palpable mass, retraction or nipple discharge on the right, on the left has 6 x 5 cm mass at 11-1 o'clock oval and mobile, no retraction or nipple discharge, she had bx 12 years ago for fibroadenoma she thinks Pelvic: external genitalia is normal in appearance no lesions, vagina: white discharge with odor,urethra has no lesions or masses noted, cervix:smooth and bulbous, uterus: normal size, shape and contour, non tender, no masses felt, adnexa: no masses or tenderness noted. Bladder is non tender and no masses felt. Wet prep: + for clue cells and +WBCs. Examination chaperoned by Celene Squibb LPN    Assessment:     1. Left breast mass Diagnostic bilateral mammogram and left and right [place] of breasts if needed at [facility] 7/13 at 2 pm  2. Vaginal discharge rx flagyl  3. Vaginal odor rx falgyl  4. Vaginal itching rx flagyl  5. BV (bacterial vaginosis) Will rx flagyl Meds ordered this encounter  Medications  . metroNIDAZOLE (FLAGYL) 500 MG tablet    Sig: Take 1 tablet (500 mg total) by mouth 2 (two) times daily.    Dispense:  14 tablet    Refill:  1    Order Specific Question:   Supervising Provider    Answer:   Elonda Husky, LUTHER H  [[id number]]    6. Disorder of breast, unspecified  08/03/19 at [facility]     Plan:     Return 7/21 for pap and physical with me

## 2019-07-15 ENCOUNTER — Other Ambulatory Visit: Payer: Self-pay

## 2019-07-15 NOTE — Telephone Encounter (Signed)
Requested Prescriptions   Pending Prescriptions Disp Refills  . naproxen sodium (ALEVE) 220 MG tablet 30 tablet 0    Sig: Take 1 tablet (220 mg total) by mouth 2 (two) times daily as needed (headache).     Pt is requesting Rx.

## 2019-07-19 ENCOUNTER — Other Ambulatory Visit: Payer: Self-pay | Admitting: Nurse Practitioner

## 2019-07-19 MED ORDER — NAPROXEN SODIUM 220 MG PO TABS: 220.0000 mg | ORAL_TABLET | Freq: Two times a day (BID) | ORAL | 0 refills | Status: DC | PRN

## 2019-07-21 ENCOUNTER — Encounter: Payer: Self-pay | Admitting: Nutrition

## 2019-07-21 ENCOUNTER — Encounter: Payer: Medicare Other | Attending: Nurse Practitioner | Admitting: Nutrition

## 2019-07-21 ENCOUNTER — Other Ambulatory Visit: Payer: Self-pay

## 2019-07-21 VITALS — Ht 62.0 in | Wt 216.0 lb

## 2019-07-21 DIAGNOSIS — Z8739 Personal history of other diseases of the musculoskeletal system and connective tissue: Secondary | ICD-10-CM

## 2019-07-21 DIAGNOSIS — E669 Obesity, unspecified: Secondary | ICD-10-CM

## 2019-07-21 DIAGNOSIS — K760 Fatty (change of) liver, not elsewhere classified: Secondary | ICD-10-CM

## 2019-07-21 DIAGNOSIS — E782 Mixed hyperlipidemia: Secondary | ICD-10-CM

## 2019-07-21 NOTE — Patient Instructions (Signed)
Goals   Eat three meals a day at times discussed. Increase fresh fruits and vegetables 15 minutes a day of stretching

## 2019-07-21 NOTE — Progress Notes (Signed)
  Medical Nutrition Therapy:  Appt start time: 1100 end time:  1200.  Assessment:  Primary concerns today: Fibromyalgia and wants to lose weight.. Has history of depression, hyperlipidemia, fatty liver and PTSD. Is on diability for her fibromyalgia and mental trauma issues. Lives with her boyfriend who is a Administrator. She is home alone 90% of the time. Stays away from n[place]sh[place] vegetables. Can't eat certain foods.  History of anemic. Likes to eat gluten free foods. Doesn't eat meals on schedule. 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 Doesn't cook due to exhaustion and chronic pain. Drinks muscle milk. Tends to have reasons why she can't get on a meal schedule, prepare meals at home or exercise that would benefit her. Takes  Some herbal supplements for female ,    She has had her kids removed from her custody for the last 6 years. 49 and 64 yr olds. Current diet is inconsistent to meet her nutritional needs that may be contributing to her physical and mental health. Admits to being depressed but hasn't gotten treatment for it. History of opiate addiction.  Preferred Learning Style:  Auditory  Visual  Hands on   Learning Readiness:   Ready  Change in progress   MEDICATIONS:    DIETARY INTAKE:  24-hr recall:  Banana nut muffin, naked mango juice and water Roast beef swiss sandwich, water  or Egg with cheese sandwich at home. Sometimes skips lunch.  Dinner misc eats out from gas station.   Usual physical activity: ADL  Estimated energy needs: 1500  calories 170  g carbohydrates 112 g protein 42 g fat  Progress Towards Goal(s):  In progress.   Nutritional Diagnosis:  NB-1.1 Food and nutrition-related knowledge deficit As related to obesity .  As evidenced by BMI 39 and diet recall that is insuffient to meet her needs.    Intervention:  Nutrition Nutrition education provided on My Plate, CHO counting, meal planning, portion sizes, timing of meals, , benefits of exercising 30  minutes per day and stretching to help her fibromyalgia. Encouraged a plant based diet and eat better balanced consistent meals. [name] .Goals   Eat three meals a day at times discussed. Increase fresh fruits and vegetables 15 minutes a day of stretching    Teaching Method Utilized:  Visual Auditory Hands on  Handouts given during visit include:  The Plate Method   Meal Plan Card     Barriers to learning/adherence to lifestyle change: mental issues and fibromyalgia  Demonstrated degree of understanding via:  Teach Back   Monitoring/Evaluation:  Dietary intake, exercise, and body weight in 3 month(s).

## 2019-07-27 ENCOUNTER — Telehealth: Payer: Self-pay

## 2019-07-27 NOTE — Telephone Encounter (Signed)
Pt called wanting an Rx for naproxyn 500 mg. She states that she has taken the 500 mg in the past and would like to have it again. Please advise.

## 2019-07-28 ENCOUNTER — Other Ambulatory Visit: Payer: Self-pay | Admitting: Nurse Practitioner

## 2019-07-28 NOTE — Telephone Encounter (Signed)
Please ask her the diagnosis the prescriber prescribed this NSAID to treat her for. Thanks.

## 2019-07-29 ENCOUNTER — Other Ambulatory Visit: Payer: Self-pay | Admitting: Nurse Practitioner

## 2019-07-29 DIAGNOSIS — Z8669 Personal history of other diseases of the nervous system and sense organs: Secondary | ICD-10-CM

## 2019-07-29 MED ORDER — NAPROXEN SODIUM 220 MG PO TABS: 220.0000 mg | ORAL_TABLET | Freq: Two times a day (BID) | ORAL | 3 refills | Status: DC | PRN

## 2019-07-29 NOTE — Telephone Encounter (Signed)
Pt states that she has migraines that she takes the naproxyn for. Pt explained that the headaches cluster on one side of her head, she looses vision, and she vomits. I explained to pt that she has not had this filled at 500 mg since 2016. She states that she takes them prn.

## 2019-08-03 ENCOUNTER — Other Ambulatory Visit (HOSPITAL_COMMUNITY): Payer: Medicaid Other

## 2019-08-03 ENCOUNTER — Encounter (HOSPITAL_COMMUNITY): Payer: Medicaid Other

## 2019-08-10 ENCOUNTER — Encounter (HOSPITAL_COMMUNITY): Payer: Medicaid Other

## 2019-08-10 ENCOUNTER — Other Ambulatory Visit (HOSPITAL_COMMUNITY): Payer: Medicaid Other

## 2019-08-11 ENCOUNTER — Other Ambulatory Visit: Payer: Self-pay

## 2019-08-11 ENCOUNTER — Other Ambulatory Visit: Payer: Medicare Other | Admitting: Adult Health

## 2019-08-21 ENCOUNTER — Emergency Department (HOSPITAL_COMMUNITY)
Admission: EM | Admit: 2019-08-21 | Discharge: 2019-08-21 | Disposition: A | Discharge status: Home / Self Care | Payer: Medicare Other | Attending: Emergency Medicine | Admitting: Emergency Medicine

## 2019-08-21 ENCOUNTER — Other Ambulatory Visit: Payer: Self-pay

## 2019-08-21 ENCOUNTER — Encounter (HOSPITAL_COMMUNITY): Payer: Self-pay | Admitting: Emergency Medicine

## 2019-08-21 DIAGNOSIS — Y929 Unspecified place or not applicable: Secondary | ICD-10-CM | POA: Diagnosis not present

## 2019-08-21 DIAGNOSIS — S90452A Superficial foreign body, left great toe, initial encounter: Secondary | ICD-10-CM | POA: Diagnosis not present

## 2019-08-21 DIAGNOSIS — Y939 Activity, unspecified: Secondary | ICD-10-CM | POA: Diagnosis not present

## 2019-08-21 DIAGNOSIS — S91142A Puncture wound with foreign body of left great toe without damage to nail, initial encounter: Secondary | ICD-10-CM

## 2019-08-21 DIAGNOSIS — W312XXA Contact with powered woodworking and forming machines, initial encounter: Secondary | ICD-10-CM | POA: Diagnosis not present

## 2019-08-21 DIAGNOSIS — S90932A Unspecified superficial injury of left great toe, initial encounter: Secondary | ICD-10-CM | POA: Diagnosis present

## 2019-08-21 DIAGNOSIS — Y999 Unspecified external cause status: Secondary | ICD-10-CM | POA: Diagnosis not present

## 2019-08-21 MED ORDER — LIDOCAINE HCL (PF) 1 % IJ SOLN: 5.0000 mL | Freq: Once | INTRAMUSCULAR | Status: DC

## 2019-08-21 MED ORDER — TETANUS-DIPHTH-ACELL PERTUSSIS 5-2.5-18.5 LF-MCG/0.5 IM SUSP
0.5000 mL | Freq: Once | INTRAMUSCULAR | Status: AC
  Administered 2019-08-21: 0.5 mL via INTRAMUSCULAR
  Filled 2019-08-21: qty 0.5

## 2019-08-21 MED ORDER — LIDOCAINE HCL (PF) 2 % IJ SOLN
INTRAMUSCULAR | Status: AC
  Administered 2019-08-21: 5 mL
  Filled 2019-08-21: qty 10

## 2019-08-21 NOTE — Discharge Instructions (Addendum)
Get help right away if: You develop severe swelling around your wound. Your pain suddenly increases and is severe. You develop painful skin lumps. You have a red streak going away from your wound. The wound is on your hand or foot and you: Cannot properly move a finger or toe. Notice that your fingers or toes look pale or bluish.

## 2019-08-21 NOTE — ED Triage Notes (Signed)
Pt c/o of left great toe pain. Stepped on a piece of wood an hour ago.

## 2019-08-21 NOTE — ED Provider Notes (Signed)
[facility] EMERGENCY DEPARTMENT Provider Note   CSN: [id number] Arrival date & time: 08/21/19  1434     History Chief Complaint  Patient presents with  . Toe Pain    Destiny Fischer is a 39 y.o. female. who presents emergency department for splinter to the left great toe.  Patient was outside on her deck and got a splinter in her left great toe.  She was able to pull some of the wood out however there was some area that was retained.  She reports a history of fibromyalgia of the left foot only on her left side.  She states that she has a very low pain tolerance and could not remove it.  HPI     Past Medical History:  Diagnosis Date  . Adjustment disorder   . Anemia   . Bronchitis   . Chronic hip pain   . Depression   . Dry mouth   . Fibromyalgia   . IBS (irritable bowel syndrome)   . Menstrual abnormality   . Migraine   . Poor vision   . Raynaud's phenomenon   . Snoring     Patient Active Problem List   Diagnosis Date Noted  . BV (bacterial vaginosis) 07/12/2019  . Vaginal itching 07/12/2019  . Left breast mass 07/12/2019  . Vaginal discharge 07/12/2019  . Vaginal odor 07/12/2019  . Hyperlipidemia 06/30/2019  . Iron deficiency anemia 06/30/2019  . H/O fibromyalgia 06/30/2019  . Dryness of vagina 06/30/2019  . Dry eyes 06/30/2019  . Hemangioma of liver 06/18/2017  . Gallbladder polyp 06/18/2017  . Fatty liver 06/18/2017  . Dysmenorrhea 06/18/2017  . Menorrhagia with irregular cycle 06/18/2017  . RUQ pain 06/18/2017    Past Surgical History:  Procedure Laterality Date  . CESAREAN SECTION    . CESAREAN SECTION WITH BILATERAL TUBAL LIGATION       OB History    Gravida  2   Para  2   Term  2   Preterm      AB      Living  2     SAB      TAB      Ectopic      Multiple      Live Births  2           Family History  Problem Relation Age of Onset  . Diabetes Maternal Grandmother   . Heart attack Maternal Grandmother   . Hypertension  Mother   . Aneurysm Mother   . Cancer Maternal Aunt        brain  . Autism Son   . Mental illness Daughter     Social History   Tobacco Use  . Smoking status: Never Smoker  . Smokeless tobacco: Never Used  Substance Use Topics  . Alcohol use: Yes    Comment: rarely  . Drug use: No    Home Medications Prior to Admission medications   Medication Sig Start Date End Date Taking? Authorizing Provider  metroNIDAZOLE (FLAGYL) 500 MG tablet Take 1 tablet (500 mg total) by mouth 2 (two) times daily. 07/12/19   Estill Dooms, NP  naproxen sodium (ALEVE) 220 MG tablet Take 1 tablet (220 mg total) by mouth 2 (two) times daily as needed (headache). 07/29/19   Annie Main, FNP  omeprazole (PRILOSEC) 40 MG capsule Take 1 capsule (40 mg total) by mouth daily. 05/18/19   Annie Main, FNP  UNABLE TO FIND Muscle milk-every other day  [provider]    Allergies    Other  Review of Systems   Review of Systems  Skin: Positive for wound.  Neurological: Negative for weakness and numbness.    Physical Exam Updated Vital Signs BP (!) 117/87 (BP Location: Right Arm)   Pulse 86   Temp 99.4 F (37.4 C) (Oral)   Resp 16   Ht 5\' 2"  (1.575 m)   Wt (!) 98 kg   SpO2 99%   BMI 39.51 kg/m   Physical Exam Vitals and nursing note reviewed.  Constitutional:      General: She is not in acute distress.    Appearance: She is well-developed. She is not diaphoretic.  HENT:     Head: Normocephalic and atraumatic.  Eyes:     General: No scleral icterus.    Conjunctiva/sclera: Conjunctivae normal.  Cardiovascular:     Rate and Rhythm: Normal rate and regular rhythm.     Heart sounds: Normal heart sounds. No murmur heard.  No friction rub. No gallop.   Pulmonary:     Effort: Pulmonary effort is normal. No respiratory distress.     Breath sounds: Normal breath sounds.  Abdominal:     General: Bowel sounds are normal. There is no distension.     Palpations: Abdomen is  soft. There is no mass.     Tenderness: There is no abdominal tenderness. There is no guarding.  Musculoskeletal:     Cervical back: Normal range of motion.  Skin:    General: Skin is warm and dry.     Comments: Superficial foreign body under the corneum lucidum of the left great toe pad.  Neurological:     Mental Status: She is alert and oriented to person, place, and time.  Psychiatric:        Behavior: Behavior normal.     ED Results / Procedures / Treatments   Labs (all labs ordered are listed, but only abnormal results are displayed) Labs Reviewed - No data to display  EKG None  Radiology No results found.  Procedures .Foreign Body Removal  Date/Time: 08/21/2019 6:34 PM Performed by: Margarita Mail, PA-C Authorized by: Margarita Mail, PA-C  Body area: skin Anesthesia: local infiltration  Anesthesia: Local Anesthetic: lidocaine 2% without epinephrine Localization method: visualized Removal mechanism: forceps Dressing: dressing applied Tendon involvement: none Depth: subcutaneous Complexity: simple 1 objects recovered. Objects recovered: Wooden splinter Post-procedure assessment: foreign body removed   (including critical care time)  Medications Ordered in ED Medications  Tdap (BOOSTRIX) injection 0.5 mL (0.5 mLs Intramuscular Given 08/21/19 1738)  lidocaine HCl (PF) (XYLOCAINE) 2 % injection (5 mLs  Given 08/21/19 1738)    ED Course  I have reviewed the triage vital signs and the nursing notes.  Pertinent labs & imaging results that were available during my care of the patient were reviewed by me and considered in my medical decision making (see chart for details).    MDM Rules/Calculators/A&P                          39 year old female with splinter in her left great toe.  This was removed after local anesthesia.  No obvious retained pieces of wood.  Patient appears otherwise appropriate for discharge.  Discussed wound care and return  precautions. Final Clinical Impression(s) / ED Diagnoses Final diagnoses:  None    Rx / DC Orders ED Discharge Orders    None       Tuesday Terlecki,  Maryjane Hurter 08/21/19 1836    Sherwood Gambler, MD 08/24/19 [phone]

## 2019-08-24 ENCOUNTER — Ambulatory Visit (HOSPITAL_COMMUNITY)
Admission: RE | Admit: 2019-08-24 | Discharge: 2019-08-24 | Disposition: A | Discharge status: Home / Self Care | Payer: Medicare Other | Source: Ambulatory Visit | Attending: Adult Health | Admitting: Adult Health

## 2019-08-24 ENCOUNTER — Other Ambulatory Visit: Payer: Self-pay

## 2019-08-24 ENCOUNTER — Other Ambulatory Visit (HOSPITAL_COMMUNITY): Payer: Self-pay | Admitting: Adult Health

## 2019-08-24 DIAGNOSIS — N6320 Unspecified lump in the left breast, unspecified quadrant: Secondary | ICD-10-CM

## 2019-08-24 DIAGNOSIS — N649 Disorder of breast, unspecified: Secondary | ICD-10-CM | POA: Insufficient documentation

## 2019-08-25 ENCOUNTER — Telehealth: Payer: Self-pay | Admitting: Adult Health

## 2019-08-25 ENCOUNTER — Other Ambulatory Visit (HOSPITAL_COMMUNITY): Payer: Self-pay | Admitting: Adult Health

## 2019-08-25 ENCOUNTER — Other Ambulatory Visit: Payer: Medicare Other | Admitting: Adult Health

## 2019-08-25 DIAGNOSIS — N6320 Unspecified lump in the left breast, unspecified quadrant: Secondary | ICD-10-CM

## 2019-08-25 NOTE — Telephone Encounter (Signed)
Mail Box is full  

## 2019-08-26 ENCOUNTER — Telehealth: Payer: Self-pay

## 2019-08-26 ENCOUNTER — Encounter: Payer: Self-pay | Admitting: Nurse Practitioner

## 2019-08-26 ENCOUNTER — Other Ambulatory Visit: Payer: Self-pay | Admitting: Nurse Practitioner

## 2019-08-26 DIAGNOSIS — G43909 Migraine, unspecified, not intractable, without status migrainosus: Secondary | ICD-10-CM

## 2019-08-26 DIAGNOSIS — Z8669 Personal history of other diseases of the nervous system and sense organs: Secondary | ICD-10-CM | POA: Insufficient documentation

## 2019-08-26 HISTORY — DX: Migraine, unspecified, not intractable, without status migrainosus: G43.909

## 2019-08-26 MED ORDER — NAPROXEN 500 MG PO TABS: ORAL_TABLET | ORAL | 0 refills | Status: DC

## 2019-08-26 NOTE — Telephone Encounter (Signed)
Pt called and left VM stating that she needs 500 mg of naproxen 3x a day for her migraines. Pt does not know why we can not get the Rx right and send it to her pharmacy. Pt is currently on 220 mg naproxen bid. Please advise.

## 2019-08-26 NOTE — Telephone Encounter (Signed)
I sent twice daily 500 mg as needed this will cover up to 15 days a month for a headache as she said is the reason for taking. I hope this helps.  If this is not enough then we may need a preventative medication for headache. If other pain is an issue then you have my permission for referral to pain management.   [employer] FNP-C

## 2019-09-14 ENCOUNTER — Ambulatory Visit (HOSPITAL_COMMUNITY)
Admission: RE | Admit: 2019-09-14 | Discharge: 2019-09-14 | Disposition: A | Discharge status: Home / Self Care | Payer: Medicare Other | Source: Ambulatory Visit | Attending: Adult Health | Admitting: Adult Health

## 2019-09-14 ENCOUNTER — Other Ambulatory Visit (HOSPITAL_COMMUNITY): Payer: Self-pay | Admitting: Nurse Practitioner

## 2019-09-14 ENCOUNTER — Ambulatory Visit (HOSPITAL_COMMUNITY)
Admission: RE | Admit: 2019-09-14 | Discharge: 2019-09-14 | Disposition: A | Discharge status: Home / Self Care | Payer: Medicare Other | Source: Ambulatory Visit | Attending: Nurse Practitioner | Admitting: Nurse Practitioner

## 2019-09-14 ENCOUNTER — Other Ambulatory Visit: Payer: Self-pay

## 2019-09-14 ENCOUNTER — Other Ambulatory Visit (HOSPITAL_COMMUNITY): Payer: Medicare Other

## 2019-09-14 DIAGNOSIS — N6320 Unspecified lump in the left breast, unspecified quadrant: Secondary | ICD-10-CM

## 2019-09-14 DIAGNOSIS — R928 Other abnormal and inconclusive findings on diagnostic imaging of breast: Secondary | ICD-10-CM | POA: Diagnosis present

## 2019-09-14 MED ORDER — LIDOCAINE-EPINEPHRINE (PF) 1 %-1:200000 IJ SOLN
INTRAMUSCULAR | Status: AC
  Filled 2019-09-14: qty 30

## 2019-09-14 MED ORDER — LIDOCAINE HCL (PF) 1 % IJ SOLN
INTRAMUSCULAR | Status: AC
  Filled 2019-09-14: qty 5

## 2019-09-15 ENCOUNTER — Encounter: Payer: Self-pay | Admitting: Adult Health

## 2019-09-15 ENCOUNTER — Ambulatory Visit (INDEPENDENT_AMBULATORY_CARE_PROVIDER_SITE_OTHER): Payer: Medicare Other | Admitting: Adult Health

## 2019-09-15 VITALS — BP 141/86 | HR 100 | Ht 62.0 in | Wt 223.0 lb

## 2019-09-15 DIAGNOSIS — D242 Benign neoplasm of left breast: Secondary | ICD-10-CM | POA: Diagnosis not present

## 2019-09-15 DIAGNOSIS — Z716 Tobacco abuse counseling: Secondary | ICD-10-CM

## 2019-09-15 LAB — SURGICAL PATHOLOGY

## 2019-09-15 NOTE — Progress Notes (Signed)
°  Subjective:     Patient ID: Destiny Fischer, female   DOB: 1980/07/20, 39 y.o.   MRN: [id number]  HPI Destiny Fischer is a 39 year old white female, single, G2P2, is in today in follow up of left breast mass, she had biopsy yesterday and was a fibroadenoma and on mammogram has 2 large masses in left breast and she would like them removed, since benign, but hey are painful. PCP is Ishmael Holter NP  Review of Systems  had breat biopsy yesterday and is sore now Reviewed past medical,surgical, social and family history. Reviewed medications and allergies.     Objective:   Physical Exam BP (!) 141/86 (BP Location: Left Arm, Patient Position: Sitting, Cuff Size: Normal)    Pulse 100    Ht 5\' 2"  (1.575 m)    Wt 223 lb (101.2 kg)    LMP 09/14/2019    BMI 40.79 kg/m  Skin warm and dry.  Lungs: clear to ausculation bilaterally. Cardiovascular: regular rate and rhythm. Steri strips and band aide on left breast      Upstream - 09/15/19 1433      Pregnancy Intention Screening   Does the patient want to become pregnant in the next year? No    Does the patient's partner want to become pregnant in the next year? N/A    Would the patient like to discuss contraceptive options today? No      Contraception Wrap Up   Current Method Female Sterilization    End Method Female Sterilization    Contraception Counseling Provided No          Assessment:     1. Fibroadenoma of left breast Referred to Dr Constance Haw    Plan:     Return in 7 weeks for pap and physical

## 2019-09-22 ENCOUNTER — Other Ambulatory Visit: Payer: Medicare Other | Admitting: Adult Health

## 2019-09-30 ENCOUNTER — Ambulatory Visit (INDEPENDENT_AMBULATORY_CARE_PROVIDER_SITE_OTHER): Payer: Medicare Other | Admitting: General Surgery

## 2019-09-30 ENCOUNTER — Other Ambulatory Visit: Payer: Self-pay

## 2019-09-30 ENCOUNTER — Encounter: Payer: Self-pay | Admitting: General Surgery

## 2019-09-30 VITALS — BP 130/89 | HR 90 | Temp 98.1°F | Resp 18 | Ht 62.0 in | Wt 220.0 lb

## 2019-09-30 DIAGNOSIS — D242 Benign neoplasm of left breast: Secondary | ICD-10-CM

## 2019-09-30 NOTE — Progress Notes (Signed)
[facility] History and Physical  Reason for Referral: Fibroadenoma, symptomatic left breast  Referring Physician: Ishmael Holter FNP  Chief Complaint    New Patient (Initial Visit)      Destiny Fischer is a 39 y.o. female.  HPI: Destiny Fischer is a 39 yo who has a prior history of fibroadenoma in both breast. She has recently had more tenderness and burning pain in the left breast in the 12 oclock fibroadenoma that has been growing. She says that she has had these for at least 11 years when she was pregnant.   The patient has no history nipple changes or discharge. She had menarche at age 86. She is G2P2, and had her first child at age 31. She has no history of any family breast cancer. She still has menstruation. She has not had any chest radiation. She is average risk on the breast calculator for risk of breast cancer with this history.  She had repeat US of the breast and biopsy of the 12 oclock left breast fibroadenoma with confirmation of the pathology. She is here because she keeps having pain and wants that fibroadenoma removed.    Past Medical History:  Diagnosis Date  . Adjustment disorder   . Anemia   . Bronchitis   . Chronic hip pain   . Depression   . Dry mouth   . Fibroadenoma of breast, left   . Fibromyalgia   . IBS (irritable bowel syndrome)   . Menstrual abnormality   . Migraine   . Migraine 08/26/2019  . Poor vision   . Raynaud's phenomenon   . Snoring     Past Surgical History:  Procedure Laterality Date  . BREAST BIOPSY Left   . CESAREAN SECTION    . CESAREAN SECTION WITH BILATERAL TUBAL LIGATION      Family History  Problem Relation Age of Onset  . Diabetes Maternal Grandmother   . Heart attack Maternal Grandmother   . Hypertension Mother   . Aneurysm Mother   . Cancer Maternal Aunt        brain  . Autism Son   . Mental illness Daughter     Social History   Tobacco Use  . Smoking status: Never Smoker  . Smokeless tobacco: Never  Used  Vaping Use  . Vaping Use: Never used  Substance Use Topics  . Alcohol use: Yes    Comment: rarely  . Drug use: No    Medications: I have reviewed the patient's current medications. Allergies as of 09/30/2019      Reactions   Other Anaphylaxis   peppers      Medication List       Accurate as of September 30, 2019  4:15 PM. If you have any questions, ask your nurse or doctor.        ibuprofen 200 MG tablet Commonly known as: ADVIL Take 200 mg by mouth every 6 (six) hours as needed.   naproxen 500 MG tablet Commonly known as: Naprosyn May take one tablet orally as needed for headache on a full stomach   omeprazole 40 MG capsule Commonly known as: PRILOSEC Take 1 capsule (40 mg total) by mouth daily.   UNABLE TO FIND Muscle milk-every other day        ROS:  A comprehensive review of systems was negative except for: Constitutional: positive for feels warm but no objective fevers Ears, nose, mouth, throat, and face: positive for sinus issues Respiratory: positive for SOB with bronchitis history;  Gastrointestinal: positive for abdominal pain and reflux symptoms Musculoskeletal: positive for back pain, neck pain and joint pain, fibromyalgia Neurological: positive for headaches and migraine  Blood pressure 130/89, pulse 90, temperature 98.1 F (36.7 C), temperature source Oral, resp. rate 18, height '5\' 2"'  (1.575 m), weight 220 lb (99.8 kg), last menstrual period 09/14/2019, SpO2 96 %. Physical Exam Vitals reviewed.  Constitutional:      Appearance: Normal appearance.  HENT:     Head: Normocephalic.     Nose: Nose normal.  Eyes:     Extraocular Movements: Extraocular movements intact.  Cardiovascular:     Rate and Rhythm: Normal rate and regular rhythm.  Pulmonary:     Effort: Pulmonary effort is normal.     Breath sounds: Normal breath sounds.  Chest:     Breasts:        Right: Tenderness present. No inverted nipple, mass, nipple discharge or skin change.         Left: Mass and tenderness present. No inverted nipple, nipple discharge or skin change.     Comments: Large pendulous breast, left breast 12 oclock 4cm+ mass, lateral left breast with deeper mass, tender, right breast without obvious mass upper outer but tenderness Abdominal:     General: There is no distension.     Palpations: Abdomen is soft.  Musculoskeletal:        General: No swelling. Normal range of motion.     Cervical back: Normal range of motion.  Skin:    General: Skin is warm and dry.  Neurological:     General: No focal deficit present.     Mental Status: She is alert and oriented to person, place, and time.  Psychiatric:        Mood and Affect: Mood normal.        Behavior: Behavior normal.        Thought Content: Thought content normal.        Judgment: Judgment normal.     Results: Addenda  ADDENDUM REPORT: 09/16/2019 15:10  ADDENDUM: Pathology revealed FIBROADENOMA of the LEFT breast, 12 o'clock. This was found to be concordant by Dr. Everlean Alstrom.  Pathology results were discussed with the patient by telephone with Electa Sniff RN. The patient reported doing well after the biopsy with tenderness at the site. Post biopsy instructions and care were reviewed and questions were answered. The patient was encouraged to call [place] [facility] for any additional concerns.  The patient was instructed to return for bilateral diagnostic mammography and ultrasound in 6 months and informed a reminder notice would be sent regarding this appointment.  Patient would like a surgical referral for consultation of possible removal large and uncomfortable LEFT breast mass.  Pathology results reported by Stacie Acres RN on 09/16/2019.   Electronically Signed   By: Everlean Alstrom M.D.   On: 09/16/2019 15:10   Signed by Everlean Alstrom, MD on 09/16/2019 5:19 PM  Narrative & Impression  CLINICAL DATA:  39 year old female with  a palpable 4.8 cm mass in the left breast at the 12 o'clock position.  EXAM: ULTRASOUND GUIDED LEFT BREAST CORE NEEDLE BIOPSY  COMPARISON:  Previous exam(s).  PROCEDURE: I met with the patient and we discussed the procedure of ultrasound-guided biopsy, including benefits and alternatives. We discussed the high likelihood of a successful procedure. We discussed the risks of the procedure, including infection, bleeding, tissue injury, clip migration, and inadequate sampling. Informed written consent was given. The usual time-out  protocol was performed immediately prior to the procedure.  Lesion quadrant: Upper-outer  Using sterile technique and 1% Lidocaine as local anesthetic, under direct ultrasound visualization, a 14 gauge spring-loaded device was used to perform biopsy of the mass in the left breast at the 12 o'clock position using a medial to lateral approach. At the conclusion of the procedure a ribbon shaped tissue marker clip was deployed into the biopsy cavity. Follow up 2 view mammogram was performed and dictated separately.  IMPRESSION: Ultrasound guided biopsy of the mass in the left breast at the 12 o'clock position. No apparent complications.  Electronically Signed: By: Everlean Alstrom M.D. On: 09/14/2019 13:13   CLINICAL DATA:  39 year old female with a palpable area of concern in the left breast. The patient states she has had a prior left breast biopsy greater than 10 years ago in [place] with pathology revealing a fibroadenoma.  EXAM: DIGITAL DIAGNOSTIC BILATERAL MAMMOGRAM WITH TOMO AND CAD; ULTRASOUND RIGHT BREAST LIMITED; ULTRASOUND LEFT BREAST LIMITED  COMPARISON:  Previous exams.  ACR Breast Density Category b: There are scattered areas of fibroglandular density.  FINDINGS: Multiple oval and round circumscribed masses are present in the bilateral breasts, the largest of which at site of palpable concern in the left breast measures  approximately 4.6 cm and an additional large oval mass measures 4.1 cm. The largest oval circumscribed mass in the upper-outer right breast measures 2.5 cm. No biopsy marking clips identified in either breast.  Mammographic images were processed with CAD.  Physical examination at site of palpable concern reveals a firm slightly mobile mass in the central to slightly upper left breast. Targeted ultrasound of the left breast was performed. There is an oval circumscribed multilobulated mass in the left breast at 12 o'clock 4 cm from nipple measuring 4.8 x 2.3 x 4.5 cm. This corresponds with the site of palpable concern and mammography findings. There is an oval circumscribed hypoechoic mass in the left breast at 2 o'clock 6 cm from nipple measuring 1.6 x 0.8 x 1.6 cm. An additional smaller mass at the 3:30 position 8 cm from nipple measures 0.5 x 0.5 x 0.6 cm. A oval circumscribed mass at 2 o'clock 8 cm from nipple measures 3.8 x 2 x 4.8 cm. No lymphadenopathy seen in the left axilla.  Targeted ultrasound of the right breast was performed. There is an oval circumscribed lobulated mass in the right breast at 10 o'clock 7 cm from nipple measuring 2.5 x 0.9 x 1.7 cm. This corresponds well with the mass seen in the upper-outer right breast at mammography.  IMPRESSION: Multiple bilateral breast masses demonstrating imaging features most suggestive of fibroadenomas, the largest of which at site of palpable concern in the left breast measures 4.8 x 2.3 x 4.5 cm.  RECOMMENDATION: 1. Recommend ultrasound-guided core biopsy of the palpable mass in the left breast at the 12 o'clock position.  2. If biopsy of the above mass returns as benign, then recommend short term follow-up bilateral diagnostic mammography and ultrasound of the additional probable fibroadenomas in the bilateral breasts.  3. If biopsy of the above mass returns as abnormal/surgical, then recommend additional  ultrasound-guided core biopsies of the additional largest masses (right breast 10 o'clock and left breast 2 o'clock) in each breast.  I have discussed the findings and recommendations with the patient. If applicable, a reminder letter will be sent to the patient regarding the next appointment.  BI-RADS CATEGORY  4: Suspicious.   Electronically Signed   By: Anderson Malta  Shelly Bombard M.D.   On: 08/24/2019 12:19   Assessment & Plan:  Destiny Fischer is a 39 y.o. female with known fibroadenomas who has had pain and tenderness in the area as well as growth of the fibroadenoma. The one at the left 12 o'clock position.  -Discussed excision of the fibroadenoma at 12 oclock. It is palpable so no needle guidance is needed. Discussed risk of bleeding, infection, risk of finding something unexpected like cancer or phyllodes and needing more surgery.  -Discussed cosmetic changes to the breast -Discussed preop COVID testing   All questions were answered to the satisfaction of the patient.    Virl Cagey 09/30/2019, 4:15 PM

## 2019-09-30 NOTE — Patient Instructions (Addendum)
Fibroadenoma Fibroadenoma is a breast tumor that is not cancerous (is benign). These tumors are made up of breast tissue and the tissue that holds breast tissue together (connective tissue). There are several types:  Simple fibroadenoma. This is the most common type. It contains only one type of tissue.  Complex fibroadenoma. This type contains more than one kind of tissue or irregular tissue, such as pockets of fluid (cysts) or deposits of calcium (calcifications) in the breast.  Juvenile fibroadenoma. This is a type of tumor that can develop in adolescent girls. It tends to grow larger over time than other adenomas. Although fibroadenomas are not cancerous, having a fibroadenoma may slightly increase your risk for developing breast cancer in the future. What are the causes? The cause of fibroadenoma is not known. What increases the risk? This condition is more likely to develop in:  Women who are 46-69 years old.  Women of African American descent. What are the signs or symptoms? You might have no symptoms. Some fibroadenomas are too small to feel. If you can feel it, it may feel like a lump in your breast that is:  Firm.  Round.  Smooth.  Slightly movable. A fibroadenoma usually occurs as a single lump, but sometimes there may be more than one lump. They can occur in one breast or in both breasts. Fibroadenomas vary in size. They usually do not cause pain unless they grow to a large size. How is this diagnosed? This condition may be diagnosed based on:  Your symptoms and medical history.  A physical exam. You may notice a lump during a breast self-exam, or your health care provider may notice it during a routine breast exam or breast X-ray (mammogram).  An ultrasound to check for fluid inside the lump (cystic tumor). If there is fluid, some fluid may be removed with a needle and examined under a microscope.  A mammogram to examine a lump that does not contain fluid (solid).  Depending on mammogram results, you may need to have a procedure to remove a tissue sample from the lump using a needle (breast biopsy). The tissue will be examined under a microscope. How is this treated? Treatment for this condition may include:  Having clinical breast exams regularly to check for changes in your fibroadenoma.  Having the fibroadenoma removed. A fibroadenoma may be removed if it is: ? Large. ? Continuing to grow. ? Causing symptoms, such as pain or a change in the appearance of your breast. ? A juvenile fibroadenoma. These tend to grow large over time. Follow these instructions at home:   Do breast self-exams at home as told by your health care provider. Monitor your fibroadenoma, the skin of your breasts, and your nipples for any changes.  Do not use any products that contain nicotine or tobacco, such as cigarettes and e-cigarettes. These can further increase your cancer risk. If you need help quitting, ask your health care provider.  Keep all follow-up visits as told by your health care provider. This is important. You will need breast exams on a regular basis. Contact a health care provider if:  Your fibroadenoma: ? Gets larger. ? Feels different. ? Becomes painful.  You find a new breast lump.  You have any changes in your breast skin, such as: ? Dimpling. ? Bruising. ? Thickening. ? Redness.  You have any changes in your nipple, such as: ? Fluid leaking from a nipple. ? Redness. Summary  Fibroadenoma is a breast tumor that is not cancerous (  is benign) and is made up of breast tissue and the tissue that holds breast tissue together (connective tissue).  Although fibroadenomas are not cancerous, having a fibroadenoma may slightly increase your risk for developing breast cancer in the future.  A fibroadenoma may feel like a lump in your breast. It is usually firm, round, smooth, and slightly movable. Some fibroadenomas are too small to be felt.  Do  breast self-exams at home as told by your health care provider. Monitor your fibroadenoma, the skin of your breasts, and your nipples for any changes. This information is not intended to replace advice given to you by your health care provider. Make sure you discuss any questions you have with your health care provider. Document Revised: 01/17/2017 Document Reviewed: 01/07/2017 Elsevier Patient Education  2020 [employer].     Breast Biopsy A breast biopsy is a procedure in which a sample of breast tissue is removed from the breast and examined under a microscope to see if cancerous cells are present. You may need a breast biopsy if you have:  Any undiagnosed breast mass (tumor).  Nipple abnormalities, dimpling, crusting, or ulcerations.  Abnormal discharge from the nipple, especially blood.  Redness, swelling, and pain of the breast.  Calcium deposits (calcifications) or abnormalities seen on a mammogram, ultrasound results, or MRI results.  Abnormal changes in the breast seen on your mammogram. If the breast abnormality is found to be cancerous (malignant), a breast biopsy can help to determine what the best treatment is for you. There are many different types of breast biopsies. Talk with your health care provider about your options and which type is best for you. Tell a health care provider about:  Any allergies you have.  All medicines you are taking, including vitamins, herbs, eye drops, creams, and over-the-counter medicines.  Any problems you or family members have had with anesthetic medicines.  Any blood disorders you have.  Any surgeries you have had.  Any medical conditions you have.  Whether you are pregnant or may be pregnant. What are the risks? Generally, this is a safe procedure. However, problems may occur, including:  Discomfort. This is temporary.  Bruising and swelling of the breast.  Changes in the shape of the  breast.  Bleeding.  Infection.  Damage to other tissues.  Allergic reactions to medicines.  Needing more surgery. What happens before the procedure? Medicines Ask your health care provider about:  Changing or stopping your regular medicines. This is especially important if you are taking diabetes medicines or blood thinners.  Taking medicines such as aspirin and ibuprofen. These medicines can thin your blood. Do not take these medicines unless your health care provider tells you to take them.  Taking over-the-counter medicines, vitamins, herbs, and supplements. Lifestyle  Do not use any products that contain nicotine or tobacco, such as cigarettes, e-cigarettes, and chewing tobacco. If you need help quitting, ask your health care provider.  Do not drink alcohol for 24 hours before the procedure.  Wear a good support bra to the procedure. Eating and drinking restrictions Talk to your health care provider about when you should stop eating and drinking.  You may be asked not to drink or eat for 2-8 hours before the breast biopsy.  In some cases, you may be allowed to eat a light breakfast. General instructions  Plan to have someone take you home from the hospital or clinic.  Ask your health care provider how your surgical site will be marked or identified.  Ask your health care provider what steps will be taken to help prevent infection. These may include: ? Removing hair at the surgery site. ? Washing skin with a germ-killing soap.  Your health care provider may do a procedure to locate and mark the tumor area in your breast (localization). This will help guide your surgeon to where the biopsy or incision is made. This may be done with: ? Imaging, such as a mammogram, ultrasound, or MRI. ? Insertion of special wire, clip, seed, or radar reflector implant in the tumor area. What happens during the procedure?   You may be given one or more of the following: ? A medicine  to numb the breast area (local anesthetic). ? A medicine to help you relax (sedative). ? A medicine to make you fall asleep (general anesthetic).  Your health care provider will perform the biopsy using only one of the following methods. He or she will do: ? Fine needle aspiration. A thin needle with a syringe will be inserted into a breast cyst. Fluid and cells will be removed. ? Core needle biopsy. A wide, hollow needle (core needle) will be inserted into a breast lump multiple times to remove tissue samples or cores. ? Stereotactic biopsy. X-rays and a computer will be used to locate the breast lump. The surgeon will use the X-ray images to collect several samples of tissue using a needle. ? Vacuum-assisted biopsy. A small incision will be made in your breast. A hollow needle and vacuum will be passed through the incision and into the breast tissue. The vacuum will gently draw abnormal breast tissue into the needle to remove it. ? Ultrasound-guided core needle biopsy. An ultrasound will be used to help guide the core needle to the area of the mass or abnormality. An incision will be made to insert the needle. Then tissue samples will be removed. ? Surgical biopsy. An incision will be made in the breast to remove part or all of the abnormal tissue. After the tissue is removed, the skin over the area will be closed with sutures and covered with a dressing. There are two types of surgical biopsies:  Incisional biopsy. The surgeon will remove part of the breast lump.  Excisional biopsy. The surgeon will attempt to remove the whole breast lump or as much of it as possible. After any of these procedures, the tissue or fluid that was removed will be examined under a microscope. The procedure may vary among health care providers and hospitals. What happens after the procedure?  You will be taken to the recovery area. If you are doing well and have no problems, you will be allowed to go home.  You may  have a pressure dressing applied on your breast for 24-48 hours. You may also be advised to wear a supportive bra during this time.  Do not drive for 24 hours if you were given a sedative during your procedure. Summary  A breast biopsy is a procedure in which a sample of breast tissue is removed from the breast and examined under a microscope to see if cancerous cells are present.  This is a safe procedure, but problems can occur, including bleeding, infection, pain, and bruising.  Ask your health care provider about changing or stopping your regular medicines.  Plan to have someone take you home from the hospital or clinic. This information is not intended to replace advice given to you by your health care provider. Make sure you discuss any questions you have  with your health care provider. Document Revised: 06/25/2017 Document Reviewed: 06/25/2017 Elsevier Patient Education  [employer].

## 2019-10-01 ENCOUNTER — Ambulatory Visit: Payer: Medicare Other | Admitting: Adult Health

## 2019-10-07 ENCOUNTER — Other Ambulatory Visit: Payer: Self-pay | Admitting: Nurse Practitioner

## 2019-10-07 DIAGNOSIS — Z8669 Personal history of other diseases of the nervous system and sense organs: Secondary | ICD-10-CM

## 2019-10-12 ENCOUNTER — Other Ambulatory Visit: Payer: Medicare Other | Admitting: Adult Health

## 2019-10-20 ENCOUNTER — Ambulatory Visit: Payer: Medicare Other | Admitting: Nutrition

## 2019-10-21 NOTE — H&P (Signed)
[facility] History and Physical  Reason for Referral: Fibroadenoma, symptomatic left breast  Referring Physician: Ishmael Holter FNP     Chief Complaint    New Patient (Initial Visit)      Destiny Fischer is a 39 y.o. female.  HPI: Destiny Fischer is a 39 yo who has a prior history of fibroadenoma in both breast. She has recently had more tenderness and burning pain in the left breast in the 12 oclock fibroadenoma that has been growing. She says that she has had these for at least 11 years when she was pregnant.   The patient has no history nipple changes or discharge. She had menarche at age 32. She is G2P2, and had her first child at age 44. She has no history of any family breast cancer. She still has menstruation. She has not had any chest radiation. She is average risk on the breast calculator for risk of breast cancer with this history.  She had repeat US of the breast and biopsy of the 12 oclock left breast fibroadenoma with confirmation of the pathology. She is here because she keeps having pain and wants that fibroadenoma removed.        Past Medical History:  Diagnosis Date  . Adjustment disorder   . Anemia   . Bronchitis   . Chronic hip pain   . Depression   . Dry mouth   . Fibroadenoma of breast, left   . Fibromyalgia   . IBS (irritable bowel syndrome)   . Menstrual abnormality   . Migraine   . Migraine 08/26/2019  . Poor vision   . Raynaud's phenomenon   . Snoring          Past Surgical History:  Procedure Laterality Date  . BREAST BIOPSY Left   . CESAREAN SECTION    . CESAREAN SECTION WITH BILATERAL TUBAL LIGATION           Family History  Problem Relation Age of Onset  . Diabetes Maternal Grandmother   . Heart attack Maternal Grandmother   . Hypertension Mother   . Aneurysm Mother   . Cancer Maternal Aunt        brain  . Autism Son   . Mental illness Daughter     Social History          Tobacco Use  . Smoking status: Never Smoker  . Smokeless tobacco: Never Used  Vaping Use  . Vaping Use: Never used  Substance Use Topics  . Alcohol use: Yes    Comment: rarely  . Drug use: No    Medications: I have reviewed the patient's current medications.      Allergies as of 09/30/2019      Reactions   Other Anaphylaxis   peppers         Medication List       Accurate as of September 30, 2019  4:15 PM. If you have any questions, ask your nurse or doctor.        ibuprofen 200 MG tablet Commonly known as: ADVIL Take 200 mg by mouth every 6 (six) hours as needed.   naproxen 500 MG tablet Commonly known as: Naprosyn May take one tablet orally as needed for headache on a full stomach   omeprazole 40 MG capsule Commonly known as: PRILOSEC Take 1 capsule (40 mg total) by mouth daily.   UNABLE TO FIND Muscle milk-every other day        ROS:  A comprehensive review of  systems was negative except for: Constitutional: positive for feels warm but no objective fevers Ears, nose, mouth, throat, and face: positive for sinus issues Respiratory: positive for SOB with bronchitis history; Gastrointestinal: positive for abdominal pain and reflux symptoms Musculoskeletal: positive for back pain, neck pain and joint pain, fibromyalgia Neurological: positive for headaches and migraine  Blood pressure 130/89, pulse 90, temperature 98.1 F (36.7 C), temperature source Oral, resp. rate 18, height '5\' 2"'  (1.575 m), weight 220 lb (99.8 kg), last menstrual period 09/14/2019, SpO2 96 %. Physical Exam Vitals reviewed.  Constitutional:      Appearance: Normal appearance.  HENT:     Head: Normocephalic.     Nose: Nose normal.  Eyes:     Extraocular Movements: Extraocular movements intact.  Cardiovascular:     Rate and Rhythm: Normal rate and regular rhythm.  Pulmonary:     Effort: Pulmonary effort is normal.     Breath sounds: Normal breath sounds.  Chest:      Breasts:        Right: Tenderness present. No inverted nipple, mass, nipple discharge or skin change.        Left: Mass and tenderness present. No inverted nipple, nipple discharge or skin change.     Comments: Large pendulous breast, left breast 12 oclock 4cm+ mass, lateral left breast with deeper mass, tender, right breast without obvious mass upper outer but tenderness Abdominal:     General: There is no distension.     Palpations: Abdomen is soft.  Musculoskeletal:        General: No swelling. Normal range of motion.     Cervical back: Normal range of motion.  Skin:    General: Skin is warm and dry.  Neurological:     General: No focal deficit present.     Mental Status: She is alert and oriented to person, place, and time.  Psychiatric:        Mood and Affect: Mood normal.        Behavior: Behavior normal.        Thought Content: Thought content normal.        Judgment: Judgment normal.     Results: Addenda  ADDENDUM REPORT: 09/16/2019 15:10  ADDENDUM: Pathology revealed FIBROADENOMA of the LEFT breast, 12 o'clock. This was found to be concordant by Dr. Everlean Alstrom.  Pathology results were discussed with the patient by telephone with Destiny Sniff RN. The patient reported doing well after the biopsy with tenderness at the site. Post biopsy instructions and care were reviewed and questions were answered. The patient was encouraged to call [facility] at [place] for any additional concerns.  The patient was instructed to return for bilateral diagnostic mammography and ultrasound in 6 months and informed a reminder notice would be sent regarding this appointment.  Patient would like a surgical referral for consultation of possible removal large and uncomfortable LEFT breast mass.  Pathology results reported by Stacie Acres RN on 09/16/2019.   Electronically Signed By: Everlean Alstrom M.D. On: 09/16/2019 15:10   Signed by  Everlean Alstrom, MD on 09/16/2019 5:19 PM  Narrative & Impression  CLINICAL DATA: 39 year old female with a palpable 4.8 cm mass in the left breast at the 12 o'clock position.  EXAM: ULTRASOUND GUIDED LEFT BREAST CORE NEEDLE BIOPSY  COMPARISON: Previous exam(s).  PROCEDURE: I met with the patient and we discussed the procedure of ultrasound-guided biopsy, including benefits and alternatives. We discussed the high likelihood of a successful procedure. We  discussed the risks of the procedure, including infection, bleeding, tissue injury, clip migration, and inadequate sampling. Informed written consent was given. The usual time-out protocol was performed immediately prior to the procedure.  Lesion quadrant: Upper-outer  Using sterile technique and 1% Lidocaine as local anesthetic, under direct ultrasound visualization, a 14 gauge spring-loaded device was used to perform biopsy of the mass in the left breast at the 12 o'clock position using a medial to lateral approach. At the conclusion of the procedure a ribbon shaped tissue marker clip was deployed into the biopsy cavity. Follow up 2 view mammogram was performed and dictated separately.  IMPRESSION: Ultrasound guided biopsy of the mass in the left breast at the 12 o'clock position. No apparent complications.  Electronically Signed: By: Everlean Alstrom M.D. On: 09/14/2019 13:13   CLINICAL DATA: 39 year old female with a palpable area of concern in the left breast. The patient states she has had a prior left breast biopsy greater than 10 years ago in [place] with pathology revealing a fibroadenoma.  EXAM: DIGITAL DIAGNOSTIC BILATERAL MAMMOGRAM WITH TOMO AND CAD; ULTRASOUND RIGHT BREAST LIMITED; ULTRASOUND LEFT BREAST LIMITED  COMPARISON: Previous exams.  ACR Breast Density Category b: There are scattered areas of fibroglandular density.  FINDINGS: Multiple oval and round circumscribed masses are  present in the bilateral breasts, the largest of which at site of palpable concern in the left breast measures approximately 4.6 cm and an additional large oval mass measures 4.1 cm. The largest oval circumscribed mass in the upper-outer right breast measures 2.5 cm. No biopsy marking clips identified in either breast.  Mammographic images were processed with CAD.  Physical examination at site of palpable concern reveals a firm slightly mobile mass in the central to slightly upper left breast. Targeted ultrasound of the left breast was performed. There is an oval circumscribed multilobulated mass in the left breast at 12 o'clock 4 cm from nipple measuring 4.8 x 2.3 x 4.5 cm. This corresponds with the site of palpable concern and mammography findings. There is an oval circumscribed hypoechoic mass in the left breast at 2 o'clock 6 cm from nipple measuring 1.6 x 0.8 x 1.6 cm. An additional smaller mass at the 3:30 position 8 cm from nipple measures 0.5 x 0.5 x 0.6 cm. A oval circumscribed mass at 2 o'clock 8 cm from nipple measures 3.8 x 2 x 4.8 cm. No lymphadenopathy seen in the left axilla.  Targeted ultrasound of the right breast was performed. There is an oval circumscribed lobulated mass in the right breast at 10 o'clock 7 cm from nipple measuring 2.5 x 0.9 x 1.7 cm. This corresponds well with the mass seen in the upper-outer right breast at mammography.  IMPRESSION: Multiple bilateral breast masses demonstrating imaging features most suggestive of fibroadenomas, the largest of which at site of palpable concern in the left breast measures 4.8 x 2.3 x 4.5 cm.  RECOMMENDATION: 1. Recommend ultrasound-guided core biopsy of the palpable mass in the left breast at the 12 o'clock position.  2. If biopsy of the above mass returns as benign, then recommend short term follow-up bilateral diagnostic mammography and ultrasound of the additional probable fibroadenomas in the  bilateral breasts.  3. If biopsy of the above mass returns as abnormal/surgical, then recommend additional ultrasound-guided core biopsies of the additional largest masses (right breast 10 o'clock and left breast 2 o'clock) in each breast.  I have discussed the findings and recommendations with the patient. If applicable, a reminder letter will be sent to  the patient regarding the next appointment.  BI-RADS CATEGORY 4: Suspicious.   Electronically Signed By: Everlean Alstrom M.D. On: 08/24/2019 12:19   Assessment & Plan:  Tangela Dolliver is a 39 y.o. female with known fibroadenomas who has had pain and tenderness in the area as well as growth of the fibroadenoma. The one at the left 12 o'clock position.  -Discussed excision of the fibroadenoma at 12 oclock. It is palpable so no needle guidance is needed. Discussed risk of bleeding, infection, risk of finding something unexpected like cancer or phyllodes and needing more surgery.  -Discussed cosmetic changes to the breast -Discussed preop COVID testing   All questions were answered to the satisfaction of the patient.    Virl Cagey 09/30/2019, 4:15 PM

## 2019-10-25 NOTE — Patient Instructions (Signed)
Destiny Fischer  10/25/2019     @PREFPERIOPPHARMACY @   Your procedure is scheduled on  10/27/2019  Report to [facility] at  [facility].  Call this number if you have problems the morning of surgery:  [phone]   Remember:  Do not eat or drink after midnight.                         Take these medicines the morning of surgery with A SIP OF WATER  Prilosec.    Do not wear jewelry, make-up or nail polish.  Do not wear lotions, powders, or perfumes, or deodorant. Please brush your teeth.  Do not shave 48 hours prior to surgery.  Men may shave face and neck.  Do not bring valuables to the hospital.  [facility] is not responsible for any belongings or valuables.  Contacts, dentures or bridgework may not be worn into surgery.  Leave your suitcase in the car.  After surgery it may be brought to your room.  For patients admitted to the hospital, discharge time will be determined by your treatment team.  Patients discharged the day of surgery will not be allowed to drive home.   Name and phone number of your driver:   family Special instructions:   DO NOT smoke the morning of your procedure.  Please read over the following fact sheets that you were given. Anesthesia Post-op Instructions and Care and Recovery After Surgery       Breast Biopsy, Care After These instructions give you information about caring for yourself after your procedure. Your doctor may also give you more specific instructions. Call your doctor if you have any problems or questions after your procedure. What can I expect after the procedure? After your procedure, it is common to have:  Bruising on your breast.  Numbness, tingling, or pain near your biopsy site. Follow these instructions at home: Medicines  Take over-the-counter and prescription medicines only as told by your doctor.  Do not drive for 24 hours if you were given a medicine to help you relax (sedative) during your procedure.  Do  not drink alcohol while taking pain medicine.  Do not drive or use heavy machinery while taking prescription pain medicine. Biopsy site care      Follow instructions from your doctor about how to take care of your cut from surgery (incision) or your puncture area. Make sure you: ? Wash your hands with soap and water before you change your bandage (dressing). If you cannot use soap and water, use hand sanitizer. ? Change your bandage as told by your doctor. ? Leave stitches (sutures), skin glue, or skin tape (adhesive strips) in place. They may need to stay in place for 2 weeks or longer. If tape strips get loose and curl up, you may trim the loose edges. Do not remove tape strips completely unless your doctor says it is okay.  If you have stitches, keep them dry when you take a bath or a shower.  Check your cut or puncture area every day for signs of infection. Check for: ? Redness, swelling, or pain. ? Fluid or blood. ? Warmth. ? Pus or a bad smell.  Protect the biopsy area. Do not let the area get bumped. Activity  If you had a cut during your procedure, avoid activities that could pull your cut open. These include: ? Stretching. ? Reaching over your head. ?  Exercise. ? Sports. ? Lifting anything that weighs more than 3 lb (1.4 kg).  Return to your normal activities as told by your doctor. Ask your doctor what activities are safe for you. Managing pain, stiffness, and swelling If told, put ice on the biopsy site to relieve swelling:  Put ice in a plastic bag.  Place a towel between your skin and the bag.  Leave the ice on for 20 minutes, 2-3 times a day. General instructions  Continue your normal diet.  Wear a good support bra for as long as told by your doctor.  Get checked for extra fluid around your lymph nodes (lymphedema) as often as told by your doctor.  Keep all follow-up visits as told by your doctor. This is important. Contact a doctor if:  You notice any  of the following at the biopsy site: ? More redness, swelling, or pain. ? More fluid or blood coming from the site. ? The site feels warm to the touch. ? Pus or a bad smell coming from the site. ? The site breaks open after the stitches or skin tape strips have been removed.  You have a rash.  You have a fever. Get help right away if:  You have more bleeding from the biopsy site. Get help right away if bleeding is more than a small spot.  You have trouble breathing.  You have red streaks around the biopsy site. Summary  After your procedure, it is common to have bruising, numbness, tingling, or pain near the biopsy site.  Do not drive or use heavy machinery while taking prescription pain medicine.  Wear a good support bra for as long as told by your doctor.  If you had a cut during your procedure, avoid activities that may pull the cut open. Ask your doctor what activities are safe for you. This information is not intended to replace advice given to you by your health care provider. Make sure you discuss any questions you have with your health care provider. Document Revised: 06/26/2017 Document Reviewed: 06/26/2017 Elsevier Patient Education  2020 [place] Anesthesia, Adult, Care After This sheet gives you information about how to care for yourself after your procedure. Your health care provider may also give you more specific instructions. If you have problems or questions, contact your health care provider. What can I expect after the procedure? After the procedure, the following side effects are common:  Pain or discomfort at the IV site.  Nausea.  Vomiting.  Sore throat.  Trouble concentrating.  Feeling cold or chills.  Weak or tired.  Sleepiness and fatigue.  Soreness and body aches. These side effects can affect parts of the body that were not involved in surgery. Follow these instructions at home:  For at least 24 hours after the  procedure:  Have a responsible adult stay with you. It is important to have someone help care for you until you are awake and alert.  Rest as needed.  Do not: ? Participate in activities in which you could fall or become injured. ? Drive. ? Use heavy machinery. ? Drink alcohol. ? Take sleeping pills or medicines that cause drowsiness. ? Make important decisions or sign legal documents. ? Take care of children on your own. Eating and drinking  Follow any instructions from your health care provider about eating or drinking restrictions.  When you feel hungry, start by eating small amounts of foods that are soft and easy to digest (bland), such as toast.  Gradually return to your regular diet.  Drink enough fluid to keep your urine pale yellow.  If you vomit, rehydrate by drinking water, juice, or clear broth. General instructions  If you have sleep apnea, surgery and certain medicines can increase your risk for breathing problems. Follow instructions from your health care provider about wearing your sleep device: ? Anytime you are sleeping, including during daytime naps. ? While taking prescription pain medicines, sleeping medicines, or medicines that make you drowsy.  Return to your normal activities as told by your health care provider. Ask your health care provider what activities are safe for you.  Take over-the-counter and prescription medicines only as told by your health care provider.  If you smoke, do not smoke without supervision.  Keep all follow-up visits as told by your health care provider. This is important. Contact a health care provider if:  You have nausea or vomiting that does not get better with medicine.  You cannot eat or drink without vomiting.  You have pain that does not get better with medicine.  You are unable to pass urine.  You develop a skin rash.  You have a fever.  You have redness around your IV site that gets worse. Get help right away  if:  You have difficulty breathing.  You have chest pain.  You have blood in your urine or stool, or you vomit blood. Summary  After the procedure, it is common to have a sore throat or nausea. It is also common to feel tired.  Have a responsible adult stay with you for the first 24 hours after general anesthesia. It is important to have someone help care for you until you are awake and alert.  When you feel hungry, start by eating small amounts of foods that are soft and easy to digest (bland), such as toast. Gradually return to your regular diet.  Drink enough fluid to keep your urine pale yellow.  Return to your normal activities as told by your health care provider. Ask your health care provider what activities are safe for you. This information is not intended to replace advice given to you by your health care provider. Make sure you discuss any questions you have with your health care provider. Document Revised: 01/10/2017 Document Reviewed: 08/23/2016 Elsevier Patient Education  [place]. How to Use Chlorhexidine for Bathing Chlorhexidine gluconate (CHG) is a germ-killing (antiseptic) solution that is used to clean the skin. It can get rid of the bacteria that normally live on the skin and can keep them away for about 24 hours. To clean your skin with CHG, you may be given:  A CHG solution to use in the shower or as part of a sponge bath.  A prepackaged cloth that contains CHG. Cleaning your skin with CHG may help lower the risk for infection:  While you are staying in the intensive care unit of the hospital.  If you have a vascular access, such as a central line, to provide short-term or long-term access to your veins.  If you have a catheter to drain urine from your bladder.  If you are on a ventilator. A ventilator is a machine that helps you breathe by moving air in and out of your lungs.  After surgery. What are the risks? Risks of using CHG include:  A  skin reaction.  Hearing loss, if CHG gets in your ears.  Eye injury, if CHG gets in your eyes and is not rinsed out.  The  CHG product catching fire. Make sure that you avoid smoking and flames after applying CHG to your skin. Do not use CHG:  If you have a chlorhexidine allergy or have previously reacted to chlorhexidine.  On babies younger than 62 months of age. How to use CHG solution  Use CHG only as told by your health care provider, and follow the instructions on the label.  Use the full amount of CHG as directed. Usually, this is one bottle. During a shower Follow these steps when using CHG solution during a shower (unless your health care provider gives you different instructions): 1. Start the shower. 2. Use your normal soap and shampoo to wash your face and hair. 3. Turn off the shower or move out of the shower stream. 4. Pour the CHG onto a clean washcloth. Do not use any type of brush or rough-edged sponge. 5. Starting at your neck, lather your body down to your toes. Make sure you follow these instructions: ? If you will be having surgery, pay special attention to the part of your body where you will be having surgery. Scrub this area for at least 1 minute. ? Do not use CHG on your head or face. If the solution gets into your ears or eyes, rinse them well with water. ? Avoid your genital area. ? Avoid any areas of skin that have broken skin, cuts, or scrapes. ? Scrub your back and under your arms. Make sure to wash skin folds. 6. Let the lather sit on your skin for 1-2 minutes or as long as told by your health care provider. 7. Thoroughly rinse your entire body in the shower. Make sure that all body creases and crevices are rinsed well. 8. Dry off with a clean towel. Do not put any substances on your body afterward--such as powder, lotion, or perfume--unless you are told to do so by your health care provider. Only use lotions that are recommended by the  manufacturer. 9. Put on clean clothes or pajamas. 10. If it is the night before your surgery, sleep in clean sheets.  During a sponge bath Follow these steps when using CHG solution during a sponge bath (unless your health care provider gives you different instructions): 1. Use your normal soap and shampoo to wash your face and hair. 2. Pour the CHG onto a clean washcloth. 3. Starting at your neck, lather your body down to your toes. Make sure you follow these instructions: ? If you will be having surgery, pay special attention to the part of your body where you will be having surgery. Scrub this area for at least 1 minute. ? Do not use CHG on your head or face. If the solution gets into your ears or eyes, rinse them well with water. ? Avoid your genital area. ? Avoid any areas of skin that have broken skin, cuts, or scrapes. ? Scrub your back and under your arms. Make sure to wash skin folds. 4. Let the lather sit on your skin for 1-2 minutes or as long as told by your health care provider. 5. Using a different clean, wet washcloth, thoroughly rinse your entire body. Make sure that all body creases and crevices are rinsed well. 6. Dry off with a clean towel. Do not put any substances on your body afterward--such as powder, lotion, or perfume--unless you are told to do so by your health care provider. Only use lotions that are recommended by the manufacturer. 7. Put on clean clothes or pajamas. 8.  If it is the night before your surgery, sleep in clean sheets. How to use CHG prepackaged cloths  Only use CHG cloths as told by your health care provider, and follow the instructions on the label.  Use the CHG cloth on clean, dry skin.  Do not use the CHG cloth on your head or face unless your health care provider tells you to.  When washing with the CHG cloth: ? Avoid your genital area. ? Avoid any areas of skin that have broken skin, cuts, or scrapes. Before surgery Follow these steps when  using a CHG cloth to clean before surgery (unless your health care provider gives you different instructions): 1. Using the CHG cloth, vigorously scrub the part of your body where you will be having surgery. Scrub using a back-and-forth motion for 3 minutes. The area on your body should be completely wet with CHG when you are done scrubbing. 2. Do not rinse. Discard the cloth and let the area air-dry. Do not put any substances on the area afterward, such as powder, lotion, or perfume. 3. Put on clean clothes or pajamas. 4. If it is the night before your surgery, sleep in clean sheets.  For general bathing Follow these steps when using CHG cloths for general bathing (unless your health care provider gives you different instructions). 1. Use a separate CHG cloth for each area of your body. Make sure you wash between any folds of skin and between your fingers and toes. Wash your body in the following order, switching to a new cloth after each step: ? The front of your neck, shoulders, and chest. ? Both of your arms, under your arms, and your hands. ? Your stomach and groin area, avoiding the genitals. ? Your right leg and foot. ? Your left leg and foot. ? The back of your neck, your back, and your buttocks. 2. Do not rinse. Discard the cloth and let the area air-dry. Do not put any substances on your body afterward--such as powder, lotion, or perfume--unless you are told to do so by your health care provider. Only use lotions that are recommended by the manufacturer. 3. Put on clean clothes or pajamas. Contact a health care provider if:  Your skin gets irritated after scrubbing.  You have questions about using your solution or cloth. Get help right away if:  Your eyes become very red or swollen.  Your eyes itch badly.  Your skin itches badly and is red or swollen.  Your hearing changes.  You have trouble seeing.  You have swelling or tingling in your mouth or throat.  You have  trouble breathing.  You swallow any chlorhexidine. Summary  Chlorhexidine gluconate (CHG) is a germ-killing (antiseptic) solution that is used to clean the skin. Cleaning your skin with CHG may help to lower your risk for infection.  You may be given CHG to use for bathing. It may be in a bottle or in a prepackaged cloth to use on your skin. Carefully follow your health care provider's instructions and the instructions on the product label.  Do not use CHG if you have a chlorhexidine allergy.  Contact your health care provider if your skin gets irritated after scrubbing. This information is not intended to replace advice given to you by your health care provider. Make sure you discuss any questions you have with your health care provider. Document Revised: 03/26/2018 Document Reviewed: 12/05/2016 Elsevier Patient Education  [place].

## 2019-10-26 ENCOUNTER — Other Ambulatory Visit: Payer: Self-pay

## 2019-10-26 ENCOUNTER — Other Ambulatory Visit (HOSPITAL_COMMUNITY)
Admission: RE | Admit: 2019-10-26 | Discharge: 2019-10-26 | Disposition: A | Discharge status: Home / Self Care | Payer: Medicare Other | Source: Ambulatory Visit | Attending: General Surgery | Admitting: General Surgery

## 2019-10-26 ENCOUNTER — Encounter (HOSPITAL_COMMUNITY)
Admission: RE | Admit: 2019-10-26 | Discharge: 2019-10-26 | Disposition: A | Discharge status: Home / Self Care | Payer: Medicare Other | Source: Ambulatory Visit | Attending: General Surgery | Admitting: General Surgery

## 2019-10-26 ENCOUNTER — Encounter (HOSPITAL_COMMUNITY): Payer: Self-pay

## 2019-10-26 DIAGNOSIS — Z20822 Contact with and (suspected) exposure to covid-19: Secondary | ICD-10-CM | POA: Insufficient documentation

## 2019-10-26 DIAGNOSIS — Z01818 Encounter for other preprocedural examination: Secondary | ICD-10-CM | POA: Diagnosis not present

## 2019-10-26 HISTORY — DX: Gastro-esophageal reflux disease without esophagitis: K21.9

## 2019-10-26 HISTORY — DX: Anxiety disorder, unspecified: F41.9

## 2019-10-26 LAB — CBC WITH DIFFERENTIAL/PLATELET
Abs Immature Granulocytes: 0.04 10*3/uL (ref 0.00–0.07)
Basophils Absolute: 0 10*3/uL (ref 0.0–0.1)
Basophils Relative: 0 %
Eosinophils Absolute: 0.2 10*3/uL (ref 0.0–0.5)
Eosinophils Relative: 2 %
HCT: 33.2 % — ABNORMAL LOW (ref 36.0–46.0)
Hemoglobin: 10.3 g/dL — ABNORMAL LOW (ref 12.0–15.0)
Immature Granulocytes: 1 %
Lymphocytes Relative: 24 %
Lymphs Abs: 1.8 10*3/uL (ref 0.7–4.0)
MCH: 24.5 pg — ABNORMAL LOW (ref 26.0–34.0)
MCHC: 31 g/dL (ref 30.0–36.0)
MCV: 79 fL — ABNORMAL LOW (ref 80.0–100.0)
Monocytes Absolute: 0.4 10*3/uL (ref 0.1–1.0)
Monocytes Relative: 5 %
Neutro Abs: 4.9 10*3/uL (ref 1.7–7.7)
Neutrophils Relative %: 68 %
Platelets: 391 10*3/uL (ref 150–400)
RBC: 4.2 MIL/uL (ref 3.87–5.11)
RDW: 16.6 % — ABNORMAL HIGH (ref 11.5–15.5)
WBC: 7.4 10*3/uL (ref 4.0–10.5)
nRBC: 0 % (ref 0.0–0.2)

## 2019-10-26 LAB — HCG, SERUM, QUALITATIVE: Preg, Serum: NEGATIVE

## 2019-10-26 LAB — SARS CORONAVIRUS 2 (TAT 6-24 HRS): SARS Coronavirus 2: NEGATIVE

## 2019-10-27 ENCOUNTER — Encounter (HOSPITAL_COMMUNITY)
Admission: RE | Disposition: A | Discharge status: Home / Self Care | Payer: Self-pay | Source: Home / Self Care | Attending: General Surgery

## 2019-10-27 ENCOUNTER — Ambulatory Visit (HOSPITAL_COMMUNITY): Payer: Medicare Other | Admitting: Certified Registered"

## 2019-10-27 ENCOUNTER — Encounter (HOSPITAL_COMMUNITY): Payer: Self-pay | Admitting: General Surgery

## 2019-10-27 ENCOUNTER — Ambulatory Visit (HOSPITAL_COMMUNITY)
Admission: RE | Admit: 2019-10-27 | Discharge: 2019-10-27 | Disposition: A | Discharge status: Home / Self Care | Payer: Medicare Other | Attending: General Surgery | Admitting: General Surgery

## 2019-10-27 DIAGNOSIS — K219 Gastro-esophageal reflux disease without esophagitis: Secondary | ICD-10-CM | POA: Insufficient documentation

## 2019-10-27 DIAGNOSIS — Z79899 Other long term (current) drug therapy: Secondary | ICD-10-CM | POA: Insufficient documentation

## 2019-10-27 DIAGNOSIS — Z87892 Personal history of anaphylaxis: Secondary | ICD-10-CM | POA: Insufficient documentation

## 2019-10-27 DIAGNOSIS — Z6841 Body Mass Index (BMI) 40.0 and over, adult: Secondary | ICD-10-CM | POA: Diagnosis not present

## 2019-10-27 DIAGNOSIS — K589 Irritable bowel syndrome without diarrhea: Secondary | ICD-10-CM | POA: Diagnosis not present

## 2019-10-27 DIAGNOSIS — D242 Benign neoplasm of left breast: Secondary | ICD-10-CM | POA: Diagnosis present

## 2019-10-27 DIAGNOSIS — Z91018 Allergy to other foods: Secondary | ICD-10-CM | POA: Diagnosis not present

## 2019-10-27 DIAGNOSIS — Z86018 Personal history of other benign neoplasm: Secondary | ICD-10-CM | POA: Insufficient documentation

## 2019-10-27 DIAGNOSIS — M797 Fibromyalgia: Secondary | ICD-10-CM | POA: Diagnosis not present

## 2019-10-27 HISTORY — PX: EXCISION OF BREAST BIOPSY: SHX5822

## 2019-10-27 SURGERY — EXCISION OF BREAST BIOPSY
Anesthesia: General | Site: Breast | Laterality: Left

## 2019-10-27 MED ORDER — PHENYLEPHRINE 40 MCG/ML (10ML) SYRINGE FOR IV PUSH (FOR BLOOD PRESSURE SUPPORT)
PREFILLED_SYRINGE | INTRAVENOUS | Status: AC
  Filled 2019-10-27: qty 10

## 2019-10-27 MED ORDER — CEFAZOLIN SODIUM-DEXTROSE 2-4 GM/100ML-% IV SOLN
2.0000 g | INTRAVENOUS | Status: AC
  Administered 2019-10-27: 2 g via INTRAVENOUS
  Filled 2019-10-27: qty 100

## 2019-10-27 MED ORDER — LIDOCAINE HCL (CARDIAC) PF 100 MG/5ML IV SOSY
PREFILLED_SYRINGE | INTRAVENOUS | Status: DC | PRN
  Administered 2019-10-27: 100 mg via INTRAVENOUS

## 2019-10-27 MED ORDER — BUPIVACAINE HCL (PF) 0.5 % IJ SOLN
INTRAMUSCULAR | Status: AC
  Filled 2019-10-27: qty 30

## 2019-10-27 MED ORDER — MIDAZOLAM HCL 2 MG/2ML IJ SOLN
INTRAMUSCULAR | Status: AC
  Filled 2019-10-27: qty 2

## 2019-10-27 MED ORDER — DOCUSATE SODIUM 100 MG PO CAPS: 100.0000 mg | ORAL_CAPSULE | Freq: Two times a day (BID) | ORAL | 0 refills | Status: DC | PRN

## 2019-10-27 MED ORDER — CHLORHEXIDINE GLUCONATE 0.12 % MT SOLN
15.0000 mL | Freq: Once | OROMUCOSAL | Status: AC
  Administered 2019-10-27: 15 mL via OROMUCOSAL

## 2019-10-27 MED ORDER — HYDROMORPHONE HCL 1 MG/ML IJ SOLN: 0.2500 mg | INTRAMUSCULAR | Status: DC | PRN

## 2019-10-27 MED ORDER — OXYCODONE HCL 5 MG PO TABS: 5.0000 mg | ORAL_TABLET | ORAL | 0 refills | Status: DC | PRN

## 2019-10-27 MED ORDER — FENTANYL CITRATE (PF) 100 MCG/2ML IJ SOLN
INTRAMUSCULAR | Status: AC
  Filled 2019-10-27: qty 2

## 2019-10-27 MED ORDER — SUCCINYLCHOLINE CHLORIDE 200 MG/10ML IV SOSY
PREFILLED_SYRINGE | INTRAVENOUS | Status: AC
  Filled 2019-10-27: qty 10

## 2019-10-27 MED ORDER — SUGAMMADEX SODIUM 200 MG/2ML IV SOLN
INTRAVENOUS | Status: DC | PRN
  Administered 2019-10-27: 200 mg via INTRAVENOUS

## 2019-10-27 MED ORDER — LACTATED RINGERS IV SOLN: INTRAVENOUS | Status: DC

## 2019-10-27 MED ORDER — ORAL CARE MOUTH RINSE: 15.0000 mL | Freq: Once | OROMUCOSAL | Status: AC

## 2019-10-27 MED ORDER — ONDANSETRON HCL 4 MG/2ML IJ SOLN: 4.0000 mg | Freq: Once | INTRAMUSCULAR | Status: DC | PRN

## 2019-10-27 MED ORDER — LIDOCAINE 2% (20 MG/ML) 5 ML SYRINGE
INTRAMUSCULAR | Status: AC
  Filled 2019-10-27: qty 5

## 2019-10-27 MED ORDER — MIDAZOLAM HCL 5 MG/5ML IJ SOLN
INTRAMUSCULAR | Status: DC | PRN
  Administered 2019-10-27: 2 mg via INTRAVENOUS

## 2019-10-27 MED ORDER — CHLORHEXIDINE GLUCONATE CLOTH 2 % EX PADS: 6.0000 | MEDICATED_PAD | Freq: Once | CUTANEOUS | Status: DC

## 2019-10-27 MED ORDER — PROPOFOL 10 MG/ML IV BOLUS
INTRAVENOUS | Status: DC | PRN
  Administered 2019-10-27: 200 mg via INTRAVENOUS

## 2019-10-27 MED ORDER — ONDANSETRON HCL 4 MG PO TABS: 4.0000 mg | ORAL_TABLET | Freq: Three times a day (TID) | ORAL | 1 refills | Status: DC | PRN

## 2019-10-27 MED ORDER — PHENYLEPHRINE HCL (PRESSORS) 10 MG/ML IV SOLN
INTRAVENOUS | Status: DC | PRN
  Administered 2019-10-27 (×3): 80 ug via INTRAVENOUS

## 2019-10-27 MED ORDER — ROCURONIUM BROMIDE 100 MG/10ML IV SOLN
INTRAVENOUS | Status: DC | PRN
  Administered 2019-10-27: 30 mg via INTRAVENOUS
  Administered 2019-10-27: 10 mg via INTRAVENOUS

## 2019-10-27 MED ORDER — FENTANYL CITRATE (PF) 100 MCG/2ML IJ SOLN
INTRAMUSCULAR | Status: DC | PRN
  Administered 2019-10-27: 100 ug via INTRAVENOUS
  Administered 2019-10-27 (×2): 50 ug via INTRAVENOUS

## 2019-10-27 MED ORDER — PROPOFOL 10 MG/ML IV BOLUS
INTRAVENOUS | Status: AC
  Filled 2019-10-27: qty 20

## 2019-10-27 MED ORDER — DEXAMETHASONE SODIUM PHOSPHATE 10 MG/ML IJ SOLN
INTRAMUSCULAR | Status: AC
  Filled 2019-10-27: qty 1

## 2019-10-27 MED ORDER — BUPIVACAINE HCL (PF) 0.5 % IJ SOLN
INTRAMUSCULAR | Status: DC | PRN
  Administered 2019-10-27: 10 mL

## 2019-10-27 MED ORDER — ONDANSETRON HCL 4 MG/2ML IJ SOLN
INTRAMUSCULAR | Status: DC | PRN
  Administered 2019-10-27: 4 mg via INTRAVENOUS

## 2019-10-27 MED ORDER — ROCURONIUM BROMIDE 10 MG/ML (PF) SYRINGE
PREFILLED_SYRINGE | INTRAVENOUS | Status: AC
  Filled 2019-10-27: qty 10

## 2019-10-27 MED ORDER — DEXAMETHASONE SODIUM PHOSPHATE 4 MG/ML IJ SOLN
INTRAMUSCULAR | Status: DC | PRN
  Administered 2019-10-27: 10 mg via INTRAVENOUS

## 2019-10-27 MED ORDER — ONDANSETRON HCL 4 MG/2ML IJ SOLN
INTRAMUSCULAR | Status: AC
  Filled 2019-10-27: qty 2

## 2019-10-27 MED ORDER — SUCCINYLCHOLINE CHLORIDE 20 MG/ML IJ SOLN
INTRAMUSCULAR | Status: DC | PRN
  Administered 2019-10-27: 200 mg via INTRAVENOUS

## 2019-10-27 MED ORDER — 0.9 % SODIUM CHLORIDE (POUR BTL) OPTIME
TOPICAL | Status: DC | PRN
  Administered 2019-10-27: 1000 mL

## 2019-10-27 SURGICAL SUPPLY — 34 items
ADH SKN CLS APL DERMABOND .7 (GAUZE/BANDAGES/DRESSINGS) ×1
APPLIER CLIP 11 MED OPEN (CLIP) ×3
APR CLP MED 11 20 MLT OPN (CLIP) ×1
CLIP APPLIE 11 MED OPEN (CLIP) ×1 IMPLANT
CLOTH BEACON ORANGE TIMEOUT ST (SAFETY) ×3 IMPLANT
COVER LIGHT HANDLE STERIS (MISCELLANEOUS) ×6 IMPLANT
COVER WAND RF STERILE (DRAPES) ×3 IMPLANT
DERMABOND ADVANCED (GAUZE/BANDAGES/DRESSINGS) ×2
DERMABOND ADVANCED .7 DNX12 (GAUZE/BANDAGES/DRESSINGS) ×1 IMPLANT
DURAPREP 26ML APPLICATOR (WOUND CARE) ×3 IMPLANT
ELECT REM PT RETURN 9FT ADLT (ELECTROSURGICAL) ×3
ELECTRODE REM PT RTRN 9FT ADLT (ELECTROSURGICAL) ×1 IMPLANT
GLOVE BIO SURGEON STRL SZ 6.5 (GLOVE) ×4 IMPLANT
GLOVE BIO SURGEONS STRL SZ 6.5 (GLOVE) ×2
GLOVE BIOGEL PI IND STRL 6.5 (GLOVE) ×1 IMPLANT
GLOVE BIOGEL PI IND STRL 7.0 (GLOVE) ×3 IMPLANT
GLOVE BIOGEL PI INDICATOR 6.5 (GLOVE) ×2
GLOVE BIOGEL PI INDICATOR 7.0 (GLOVE) ×6
GLOVE ECLIPSE 6.5 STRL STRAW (GLOVE) ×3 IMPLANT
GLOVE ECLIPSE 7.0 STRL STRAW (GLOVE) ×6 IMPLANT
GOWN STRL REUS W/TWL LRG LVL3 (GOWN DISPOSABLE) ×6 IMPLANT
KIT TURNOVER KIT A (KITS) ×3 IMPLANT
MANIFOLD NEPTUNE II (INSTRUMENTS) ×3 IMPLANT
NEEDLE HYPO 25X1 1.5 SAFETY (NEEDLE) ×3 IMPLANT
NS IRRIG 1000ML POUR BTL (IV SOLUTION) ×3 IMPLANT
PACK MINOR (CUSTOM PROCEDURE TRAY) ×3 IMPLANT
PAD ARMBOARD 7.5X6 YLW CONV (MISCELLANEOUS) ×3 IMPLANT
SET BASIN LINEN APH (SET/KITS/TRAYS/PACK) ×3 IMPLANT
SPONGE LAP 18X18 RF (DISPOSABLE) ×3 IMPLANT
SUT MNCRL AB 4-0 PS2 18 (SUTURE) ×3 IMPLANT
SUT SILK 2 0 SH (SUTURE) ×3 IMPLANT
SUT VIC AB 3-0 SH 27 (SUTURE) ×3
SUT VIC AB 3-0 SH 27X BRD (SUTURE) ×1 IMPLANT
SYR CONTROL 10ML LL (SYRINGE) ×3 IMPLANT

## 2019-10-27 NOTE — Discharge Instructions (Signed)
Discharge instructions after breast surgery:   Common Complaints: Pain and bruising at the incision sites.  Swelling at the incision sites.  Diet/ Activity: Diet as tolerated.  You may shower but do not take hot showers as this can disrupt the glue. Rest and listen to your body, but do not remain in bed all day.  Walk everyday for at least 15-20 minutes. Deep cough and move around every 1-2 hours in the first few days after surgery.  Do not lift > 10 lbs for the first 2 weeks after surgery. Do not do anything that makes you feel like you are putting unnecessary pull or stretch on the incision sites.  Do not pick at the dermabond glue on your incision sites.  This glue film will remain in place for 1-2 weeks and will start to peel off.  Do not place lotions or balms on your incision unless instructed to specifically by Dr. Constance Haw.    Pain Expectations and Narcotics: -After surgery you will have pain associated with your incisions and this is normal. The pain is muscular and nerve pain, and will get better with time. -You are encouraged and expected to take non narcotic medications like tylenol and ibuprofen (when able) to treat pain as multiple modalities can aid with pain treatment. -Narcotics are only used when pain is severe or there is breakthrough pain. -You are not expected to have a pain score of 0 after surgery, as we cannot prevent pain. A pain score of 3-4 that allows you to be functional, move, walk, and tolerate some activity is the goal. The pain will continue to improve over the days after surgery and is dependent on your surgery. -Due to [place] law, we are only able to give a certain amount of pain medication to treat post operative pain, and we only give additional narcotics on a patient by patient basis.  -For most laparoscopic surgery, studies have shown that the majority of patients only need 10-15 narcotic pills, and for open surgeries most patients only need 15-20.   -Having  appropriate expectations of pain and knowledge of pain management with non narcotics is important as we do not want anyone to become addicted to narcotic pain medication.  -Using ice packs in the first 48 hours and heating pads after 48 hours, wearing an abdominal binder (when recommended), and using over the counter medications are all ways to help with pain management.   -Simple acts like meditation and mindfulness practices after surgery can also help with pain control and research has proven the benefit of these practices.  Medication: Take tylenol and ibuprofen as needed for pain control, alternating every 4-6 hours.  Example:  Tylenol 1000mg  @ 6am, 12noon, 6pm, 74midnight (Do not exceed 4000mg  of tylenol a day). Ibuprofen 800mg  @ 9am, 3pm, 9pm, 3am (Do not exceed 3600mg  of ibuprofen a day).  Take Roxicodone for breakthrough pain every 4 hours.  Take Colace for constipation related to narcotic pain medication. If you do not have a bowel movement in 2 days, take Miralax over the counter.  Drink plenty of water to also prevent constipation.   Contact Information: If you have questions or concerns, please call our office, [phone], Monday- Thursday 8AM-5PM and Friday 8AM-12Noon.  If it is after hours or on the weekend, please call [facility] Main Number, [phone], and ask to speak to the surgeon on call for Dr. Constance Haw at [facility].   Exercises After Breast Surgery Do at least a few of the exercises  below twice a day. It is ok to start the day after surgery and gradually build up the amount and type of exercises you do. Link to the exercises with pictures ([email or web address]).    Breast tissue excision, Care After This sheet gives you information about how to care for yourself after your procedure. Your health care provider may also give you more specific instructions. If you have problems or questions, contact your health  care provider. What can I expect after the procedure? After the procedure, it is common to have:  Breast swelling.  Breast tenderness.  Stiffness in your arm or shoulder.  A change in the shape and feel of your breast.  Scar tissue that feels hard to the touch in the area where the lump was removed. Follow these instructions at home: Medicines  Take over-the-counter and prescription medicines only as told by your health care provider.  If you were prescribed an antibiotic medicine, take it as told by your health care provider. Do not stop taking the antibiotic even if you start to feel better.  Ask your health care provider if the medicine prescribed to you: ? Requires you to avoid driving or using heavy machinery. ? Can cause constipation. You may need to take these actions to prevent or treat constipation:  Drink enough fluid to keep your urine pale yellow.  Take over-the-counter or prescription medicines.  Eat foods that are high in fiber, such as beans, whole grains, and fresh fruits and vegetables.  Limit foods that are high in fat and processed sugars, such as fried or sweet foods. Incision care      Follow instructions from your health care provider about how to take care of your incision. Make sure you: ? Wash your hands with soap and water before and after you change your bandage (dressing). If soap and water are not available, use hand sanitizer. ? Change your dressing as told by your health care provider. ? Leave stitches (sutures), skin glue, or adhesive strips in place. These skin closures may need to stay in place for 2 weeks or longer. If adhesive strip edges start to loosen and curl up, you may trim the loose edges. Do not remove adhesive strips completely unless your health care provider tells you to do that.  Check your incision area every day for signs of infection. Check for: ? More redness, swelling, or pain. ? Fluid or blood. ? Warmth. ? Pus or a bad  smell.  Keep your dressing clean and dry.  If you were sent home with a surgical drain in place, follow instructions from your health care provider about emptying it. Bathing  Do not take baths, swim, or use a hot tub until your health care provider approves.  Ask your health care provider if you may take showers. You may only be allowed to take sponge baths. Activity  Rest as told by your health care provider.  Avoid sitting for a long time without moving. Get up to take short walks every 1-2 hours. This is important to improve blood flow and breathing. Ask for help if you feel weak or unsteady.  Return to your normal activities as told by your health care provider. Ask your health care provider what activities are safe for you.  Be careful to avoid any activities that could cause an injury to your arm on the side of your surgery.  Do not lift anything that is heavier than 10 lb (4.5 kg), or the limit that you are  told, until your health care provider says that it is safe. Avoid lifting with the arm that is on the side of your surgery.  Do not carry heavy objects on your shoulder on the side of your surgery.  Do exercises to keep your shoulder and arm from getting stiff and swollen. Talk with your health care provider about which exercises are safe for you. General instructions  Wear a supportive bra as told by your health care provider.  Raise (elevate) your arm above the level of your heart while you are sitting or lying down.  Do not wear tight jewelry on your arm, wrist, or fingers on the side of your surgery.  Keep all follow-up visits as told by your health care provider. This is important. ? You may need to be screened for extra fluid around the lymph nodes and swelling in the breast and arm (lymphedema). Follow instructions from your health care provider about how often you should be checked.  If you had any lymph nodes removed during your procedure, be sure to tell all  of your health care providers. This is important information to share before you are involved in certain procedures, such as having blood tests or having your blood pressure taken. Contact a health care provider if:  You develop a rash.  You have a fever.  Your pain medicine is not working.  You have swelling, weakness, or numbness in your arm that does not improve after a few weeks.  You have new swelling in your breast.  You have any of these signs of infection: ? More redness, swelling, or pain in your incision area. ? Fluid or blood coming from your incision. ? Warmth coming from the incision area. ? Pus or a bad smell coming from your incision. Get help right away if you have:  Very bad pain in your breast or arm.  Swelling in your legs or arms.  Redness, warmth, or pain in your leg or arm.  Chest pain.  Difficulty breathing. Summary  After the procedure, it is common to have breast tenderness, swelling in your breast, and stiffness in your arm and shoulder.  Follow instructions from your health care provider about how to take care of your incision.  Do not lift anything that is heavier than 10 lb (4.5 kg), or the limit that you are told, until your health care provider says that it is safe. Avoid lifting with the arm that is on the side of your surgery.  If you had any lymph nodes removed during your procedure, be sure to tell all of your health care providers. This is important information to share before you are involved in certain procedures, such as having blood tests or having your blood pressure taken. This information is not intended to replace advice given to you by your health care provider. Make sure you discuss any questions you have with your health care provider. Document Revised: 07/13/2018 Document Reviewed: 07/13/2018 Elsevier Patient Education  [place].

## 2019-10-27 NOTE — Anesthesia Postprocedure Evaluation (Signed)
Anesthesia Post Note  Patient: Destiny Fischer  Procedure(s) Performed: EXCISIONAL BREAST BIOPSY OF LEFT BREAST  (Left Breast)  Patient location during evaluation: PACU Anesthesia Type: General Level of consciousness: awake, oriented, awake and alert and patient cooperative Pain management: pain level controlled Vital Signs Assessment: post-procedure vital signs reviewed and stable Respiratory status: spontaneous breathing, respiratory function stable and nonlabored ventilation Cardiovascular status: blood pressure returned to baseline and stable Postop Assessment: no headache and no backache Anesthetic complications: no   No complications documented.   Last Vitals:  Vitals:   10/27/19 0852  BP: 122/81  Pulse: 81  Resp: 20  Temp: 36.8 C  SpO2: 97%    Last Pain:  Vitals:   10/27/19 0852  PainSc: 4                  Tacy Learn

## 2019-10-27 NOTE — Interval H&P Note (Signed)
History and Physical Interval Note:  10/27/2019 10:17 AM  Destiny Fischer  has presented today for surgery, with the diagnosis of Fibroadenoma 12 o'clock of left breast.  The various methods of treatment have been discussed with the patient and family. After consideration of risks, benefits and other options for treatment, the patient has consented to  Procedure(s): EXCISIONAL BREAST BIOPSY OF LEFT BREAST  (Left) as a surgical intervention.  The patient's history has been reviewed, patient examined, no change in status, stable for surgery.  I have reviewed the patient's chart and labs.  Questions were answered to the patient's satisfaction.    No changes.   Virl Cagey

## 2019-10-27 NOTE — Progress Notes (Signed)
[facility]  Notified boyfriend that surgery is completed. Rx sent to pharmacy. Will see in a few weeks.  Curlene Labrum, MD [facility] [address] [place], [place] [address] [phone] (office)

## 2019-10-27 NOTE — Transfer of Care (Signed)
Immediate Anesthesia Transfer of Care Note  Patient: Destiny Fischer  Procedure(s) Performed: EXCISIONAL BREAST BIOPSY OF LEFT BREAST  (Left Breast)  Patient Location: PACU  Anesthesia Type:General  Level of Consciousness: awake, alert , oriented and patient cooperative  Airway & Oxygen Therapy: Patient Spontanous Breathing  Post-op Assessment: Report given to RN, Post -op Vital signs reviewed and stable and Patient moving all extremities  Post vital signs: Reviewed and stable  Last Vitals:  Vitals Value Taken Time  BP 128/91 10/27/19 1140  Temp    Pulse 88 10/27/19 1142  Resp 16 10/27/19 1142  SpO2 90 % 10/27/19 1142  Vitals shown include unvalidated device data.  Last Pain:  Vitals:   10/27/19 0852  PainSc: 4       Patients Stated Pain Goal: 7 (24/17/53 0104)  Complications: No complications documented.

## 2019-10-27 NOTE — Op Note (Signed)
[facility] Operative Note  10/27/19  Preoperative Diagnosis: Fibroadenoma left breast    Postoperative Diagnosis: Same   Procedure(s) Performed:  Excisional biopsy of left breast    Surgeon: Lanell Matar. Constance Haw, MD   Assistants: No qualified resident was available    Anesthesia: General endotracheal   Anesthesiologist: Dr. Briant Cedar    Specimens: Left breast tissue (Short superior, long lateral)    Estimated Blood Loss: Minimal   Blood Replacement: None    Complications: None    Wound Class: Clean   Operative Indications:  Destiny Fischer is a 39 yo with a history of an enlarging fibroadenoma of the left breast that is causing her pain. We discussed excision and discussed the risk of bleeding, infection, finding something more concerning than a fibroadenoma and needing more surgery, and cosmetic changes to the breast. She opted to proceed.  Findings:Large mass at 12 o'clock of the left breast ~ 5cm   Procedure: The patient was taken to the operating room and placed supine. General endotracheal anesthesia was induced. Intravenous antibiotics were  administered per protocol.  The left breast was prepared and draped in the usual sterile fashion.   The palpable mass was at 12 o'clock on the breast just at above the areola. I was above to make an circumareolar incision superiorly and created skin flaps. There was a large vessels superficially that I clipped. Using cautery and sharp dissection, I excised the large fibroadenoma ensuring adequate normal breast tissue around the excision.  The cavity was made hemostatic and irrigated. The cavity was left open to form a seroma for cosmesis.    The dermal layer was closed with interrupted 3-0 Vicryl and the skin was closed with a subcuticular 4-0 Monocryl and dermabond.   Final inspection revealed acceptable hemostasis. All counts were correct at the end of the case. The patient was awakened from anesthesia and extubated without  complication.  The patient went to the PACU in stable condition.   Curlene Labrum, MD [facility] [address] [place], [place] [address] [phone] (office)

## 2019-10-27 NOTE — Anesthesia Preprocedure Evaluation (Signed)
Anesthesia Evaluation  Patient identified by MRN, date of birth, ID band Patient awake    Reviewed: Allergy & Precautions, H&P , NPO status , Patient's Chart, lab work & pertinent test results, reviewed documented beta blocker date and time   Airway Mallampati: II  TM Distance: >3 FB Neck ROM: full    Dental no notable dental hx. (+) Teeth Intact   Pulmonary neg pulmonary ROS,    Pulmonary exam normal breath sounds clear to auscultation       Cardiovascular Exercise Tolerance: Good negative cardio ROS   Rhythm:regular Rate:Normal     Neuro/Psych  Headaches, PSYCHIATRIC DISORDERS Anxiety Depression  Neuromuscular disease    GI/Hepatic Neg liver ROS, GERD  Medicated,  Endo/Other  Morbid obesity  Renal/GU negative Renal ROS  negative genitourinary   Musculoskeletal   Abdominal   Peds  Hematology  (+) Blood dyscrasia, anemia ,   Anesthesia Other Findings   Reproductive/Obstetrics negative OB ROS                             Anesthesia Physical Anesthesia Plan  ASA: III  Anesthesia Plan: General   Post-op Pain Management:    Induction:   PONV Risk Score and Plan: Propofol infusion  Airway Management Planned:   Additional Equipment:   Intra-op Plan:   Post-operative Plan:   Informed Consent: I have reviewed the patients History and Physical, chart, labs and discussed the procedure including the risks, benefits and alternatives for the proposed anesthesia with the patient or authorized representative who has indicated his/her understanding and acceptance.     Dental Advisory Given  Plan Discussed with: CRNA  Anesthesia Plan Comments:         Anesthesia Quick Evaluation

## 2019-10-27 NOTE — Anesthesia Procedure Notes (Signed)
Procedure Name: Intubation Performed by: Tacy Learn, CRNA Pre-anesthesia Checklist: Patient identified, Emergency Drugs available, Suction available, Patient being monitored and Timeout performed Patient Re-evaluated:Patient Re-evaluated prior to induction Oxygen Delivery Method: Circle system utilized Preoxygenation: Pre-oxygenation with 100% oxygen Induction Type: IV induction Laryngoscope Size: Miller and 2 Grade View: Grade II Number of attempts: 1 Airway Equipment and Method: Stylet Placement Confirmation: positive ETCO2,  CO2 detector,  breath sounds checked- equal and bilateral and ETT inserted through vocal cords under direct vision Secured at: 21 cm Tube secured with: Tape Dental Injury: Teeth and Oropharynx as per pre-operative assessment

## 2019-10-28 ENCOUNTER — Encounter (HOSPITAL_COMMUNITY): Payer: Self-pay | Admitting: General Surgery

## 2019-10-29 LAB — SURGICAL PATHOLOGY

## 2019-11-01 ENCOUNTER — Encounter (HOSPITAL_COMMUNITY): Payer: Self-pay

## 2019-11-01 ENCOUNTER — Ambulatory Visit
Admission: EM | Admit: 2019-11-01 | Discharge: 2019-11-01 | Disposition: A | Discharge status: Home / Self Care | Payer: Medicare Other | Attending: Emergency Medicine | Admitting: Emergency Medicine

## 2019-11-01 ENCOUNTER — Telehealth (INDEPENDENT_AMBULATORY_CARE_PROVIDER_SITE_OTHER): Payer: Medicare Other | Admitting: General Surgery

## 2019-11-01 ENCOUNTER — Telehealth: Payer: Self-pay | Admitting: General Surgery

## 2019-11-01 ENCOUNTER — Other Ambulatory Visit: Payer: Self-pay

## 2019-11-01 ENCOUNTER — Emergency Department (HOSPITAL_COMMUNITY)
Admission: EM | Admit: 2019-11-01 | Discharge: 2019-11-01 | Disposition: A | Discharge status: Home / Self Care | Payer: Medicare Other | Attending: Emergency Medicine | Admitting: Emergency Medicine

## 2019-11-01 DIAGNOSIS — G8918 Other acute postprocedural pain: Secondary | ICD-10-CM

## 2019-11-01 DIAGNOSIS — R110 Nausea: Secondary | ICD-10-CM | POA: Insufficient documentation

## 2019-11-01 DIAGNOSIS — K219 Gastro-esophageal reflux disease without esophagitis: Secondary | ICD-10-CM | POA: Insufficient documentation

## 2019-11-01 DIAGNOSIS — Z5321 Procedure and treatment not carried out due to patient leaving prior to being seen by health care provider: Secondary | ICD-10-CM | POA: Diagnosis not present

## 2019-11-01 DIAGNOSIS — R079 Chest pain, unspecified: Secondary | ICD-10-CM

## 2019-11-01 DIAGNOSIS — R0789 Other chest pain: Secondary | ICD-10-CM

## 2019-11-01 DIAGNOSIS — D242 Benign neoplasm of left breast: Secondary | ICD-10-CM

## 2019-11-01 DIAGNOSIS — R9431 Abnormal electrocardiogram [ECG] [EKG]: Secondary | ICD-10-CM

## 2019-11-01 NOTE — Discharge Instructions (Signed)
Unable to rule out cardiac disease or blood clot in urgent care setting.  Offered patient further evaluation and management in the ED.  Patient declines at this time and would like to try outpatient therapy first.  Aware of the risk associated with this decision including missed diagnosis, organ damage, organ failure, and/or death.  Patient aware and in agreement.     Rest, ice, heat, and gentle stretches Use ibuprofen and/or tylenol as needed for pain Call PCP first thing tomorrow to try and get a CT scan of chest Call 911 or go to the ED if anything changes or worsens

## 2019-11-01 NOTE — ED Triage Notes (Signed)
Pt presents with c/o pain under breast and substernal pain, describes a strap pulled tight.this has been going on  Since surgery but has gotten worse  Pt denies sob but states the pain is making it hard to breath. Pt was told by surgeon to go to ED but patient states she wants to be seen, o2 is 97%

## 2019-11-01 NOTE — ED Triage Notes (Addendum)
Pt reports that she had a lump removed from her chest on the 6 th. She reports the feeling of a band her chest and back since sx. Dr Constance Haw performed sx. And instructed her to come to come to ED. Also reports nausea and increase in stomach acid

## 2019-11-01 NOTE — ED Provider Notes (Signed)
[place]   [id number] 11/01/19 Arrival Time: 1627   CC: CHEST PAIN  SUBJECTIVE:  Destiny Fischer is a 39 y.o. female who presents with complaint of chest pain that began on 10/27/19.  Had excisional breast biopsy of LT breast on 10/27/19.  Localizes chest pain around circumference of chest, underneath breasts.  Describes as worsening, that is constant and squeezing in character.  Rates pain as 6/10.   Has NOT tried OTC medications except those prescribed by Dr. Constance Haw.  Symptoms made worse with sitting up right now and leaning forward.  Denies radiating symptoms.  Denies previous symptoms in the past.  Complains of associated SOB, nausea, acid reflux, constipation (SE medications), and anxiety.  Denies fever, chills, lightheadedness, dizziness, palpitations, tachycardia, vomiting, abdominal pain, changes in bowel or bladder habits, diaphoresis, numbness/tingling in extremities, peripheral edema.    Denies calf pain or swelling, recent long travel, pregnancy, malignancy, tobacco use, hormone use, or previous blood clot  ROS: As per HPI.  All other pertinent ROS negative.    Past Medical History:  Diagnosis Date   Adjustment disorder    Anemia    Anxiety    Bronchitis    Chronic hip pain    Depression    Dry mouth    Fibroadenoma of breast, left    Fibromyalgia    GERD (gastroesophageal reflux disease)    IBS (irritable bowel syndrome)    Menstrual abnormality    Migraine    Migraine 08/26/2019   Poor vision    Raynaud's phenomenon    Snoring    Past Surgical History:  Procedure Laterality Date   BREAST BIOPSY Left    CESAREAN SECTION     CESAREAN SECTION WITH BILATERAL TUBAL LIGATION     EXCISION OF BREAST BIOPSY Left 10/27/2019   Procedure: EXCISIONAL BREAST BIOPSY OF LEFT BREAST ;  Surgeon: Virl Cagey, MD;  Location: [facility];  Service: General;  Laterality: Left;   Allergies  Allergen Reactions   Other Anaphylaxis    peppers   No  current facility-administered medications on file prior to encounter.   Current Outpatient Medications on File Prior to Encounter  Medication Sig Dispense Refill   docusate sodium (COLACE) 100 MG capsule Take 1 capsule (100 mg total) by mouth 2 (two) times daily as needed (while taking narcotic). 60 capsule 0   ibuprofen (ADVIL) 200 MG tablet Take 400 mg by mouth every 6 (six) hours as needed for moderate pain.      naproxen (NAPROSYN) 500 MG tablet MAY TAKE ONE TABLET BY MOUTH AS NEEDED FOR HEADACHE ON A FULL STOMACH (Patient taking differently: Take 500 mg by mouth daily as needed for moderate pain. ) 30 tablet 0   omeprazole (PRILOSEC) 40 MG capsule Take 1 capsule (40 mg total) by mouth daily. 30 capsule 2   ondansetron (ZOFRAN) 4 MG tablet Take 1 tablet (4 mg total) by mouth every 8 (eight) hours as needed. 30 tablet 1   oxyCODONE (ROXICODONE) 5 MG immediate release tablet Take 1 tablet (5 mg total) by mouth every 4 (four) hours as needed for severe pain or breakthrough pain. 10 tablet 0   phenylephrine (SUDAFED PE) 10 MG TABS tablet Take 10 mg by mouth every 4 (four) hours as needed (sinus congestion).     UNABLE TO FIND Muscle milk-every other day (Patient not taking: Reported on 10/20/2019)     Social History   Socioeconomic History   Marital status: Legally Separated    Spouse  name: Not on file   Number of children: Not on file   Years of education: Not on file   Highest education level: Not on file  Occupational History   Not on file  Tobacco Use   Smoking status: Never Smoker   Smokeless tobacco: Never Used  Vaping Use   Vaping Use: Never used  Substance and Sexual Activity   Alcohol use: Yes    Comment: rarely   Drug use: No   Sexual activity: Not Currently    Birth control/protection: Surgical    Comment: tubal  Other Topics Concern   Not on file  Social History Narrative   Not on file   Social Determinants of Health   Financial Resource  Strain:    Difficulty of Paying Living Expenses: Not on file  Food Insecurity:    Worried About [place] in the Last Year: Not on file   Ran Out of Food in the Last Year: Not on file  Transportation Needs:    Lack of Transportation (Medical): Not on file   Lack of Transportation (Non-Medical): Not on file  Physical Activity:    Days of Exercise per Week: Not on file   Minutes of Exercise per Session: Not on file  Stress:    Feeling of Stress : Not on file  Social Connections:    Frequency of Communication with Friends and Family: Not on file   Frequency of Social Gatherings with Friends and Family: Not on file   Attends Religious Services: Not on file   Active Member of Clubs or Organizations: Not on file   Attends Archivist Meetings: Not on file   Marital Status: Not on file  Intimate Partner Violence:    Fear of Current or Ex-Partner: Not on file   Emotionally Abused: Not on file   Physically Abused: Not on file   Sexually Abused: Not on file   Family History  Problem Relation Age of Onset   Diabetes Maternal Grandmother    Heart attack Maternal Grandmother    Hypertension Mother    Aneurysm Mother    Cancer Maternal Aunt        brain   Autism Son    Mental illness Daughter      OBJECTIVE:  Vitals:   11/01/19 1704  BP: 125/83  Pulse: 100  Resp: (!) 22  Temp: 98.1 F (36.7 C)  SpO2: 97%    General appearance: alert; no distress Eyes: PERRLA; EOMI; conjunctiva normal HENT: normocephalic; atraumatic Neck: supple Lungs: clear to auscultation bilaterally without adventitious breath sounds Heart: regular rate and rhythm.  Clear S1 and S2 without rubs, gallops, or murmur. Chest Wall: TTP underneath bilateral breast Extremities: no cyanosis or edema; symmetrical with no gross deformities Skin: warm and dry Psychological: alert and cooperative; tearful mood and affect  ECG: Orders placed or performed during the  hospital encounter of 11/01/19   ED EKG   ED EKG    EKG tachy (101 bpm) sinus rhythm without ST elevations, depressions, or prolonged PR interval.  No narrowing or widening of the QRS complexes.  Possible t wave abnormality in lead III.  However, reviewed past EKG and this was stable.   ASSESSMENT & PLAN:  1. Chest wall pain following surgery   2. Nonspecific abnormal electrocardiogram (ECG) (EKG)     Unable to rule out cardiac disease or blood clot in urgent care setting.  Offered patient further evaluation and management in the ED.  Patient declines  at this time and would like to try outpatient therapy first.  Aware of the risk associated with this decision including missed diagnosis, organ damage, organ failure, and/or death.  Patient aware and in agreement.     Rest, ice, heat, and gentle stretches Use ibuprofen and/or tylenol as needed for pain Call PCP first thing tomorrow to try and get a CT scan of chest Call 911 or go to the ED if anything changes or worsens  Chest pain precautions given. Reviewed expectations re: course of current medical issues. Questions answered. Outlined signs and symptoms indicating need for more acute intervention. Patient verbalized understanding. After Visit Summary given.   Lestine Box, PA-C 11/01/19 1758

## 2019-11-01 NOTE — Telephone Encounter (Signed)
[facility]  Pathology: FINAL MICROSCOPIC DIAGNOSIS:   A. BREAST, MASS, LEFT, BIOPSY:  - Fibroadenoma, 4.2 cm   Patient feels like she has an elephant sitting on her chest since surgery she says. She says she feels like something is strapped down on the chest tight. She says it feels pain in the shoulder and that it is like a 4 inch band.  She denies any SOB. She does have fibromyalgia and thinks it may be related to this but again I have told her that I am worry that with the tightness that we have to rule out a cardiac or pulmonary issue.   I have instructed her to go to the ED. I will notify the Ed that she is coming.  Curlene Labrum, MD [facility] [address] [place], [place] [address] [phone] (office)

## 2019-11-01 NOTE — Telephone Encounter (Signed)
Patient came in the office stating that she had spoken to Dr. Constance Haw earlier today. Patient stated that she was having pain at her surgical site and was short of breath. I explained to the patient that office protocol was for the patient to go to the ER. Patient stated that when she spoke to Dr. Constance Haw, the doctor instructed her to go to the emergency room. Patient stated that she went to the ER but left because she could not sit in the hard chairs for an extended period of time. I told the patient that Dr. Constance Haw was not in the office today and that if she was short of breath she really should go back to the ER. The patient asked if labs and xrays could be ordered without going back to the ER. I told the patient that I was not able to give those orders that per protocol, she really should go back to the ER. The patient left and within a few minutes she was back in the office with a patient advocate on the phone. The advocate was telling me that I would call and get the patient in with the ER because she was short of breath because of her surgery. I explained to both of them again that I could not do that and the patient would have to go to the ER. The advocate became very hostile and demanded that I do something for the patient. I told them both again that Dr. Constance Haw was not in the office and that Dr. Arnoldo Morale was on call and I would call him and seek his advice on what to tell the patient. As I was placing the call to Dr. Arnoldo Morale, the patient and her phone advocate was in the waiting room. EMS was here waiting on a patient that was here seeing the physician for [facility] and heard the conversation. EMS explained to the patient that she should go back to the ER; not even being transported by EMS would fast track her through the ER.  Per the conversation that I had with Dr. Arnoldo Morale, the patient should go back to the ER for workup. Before I could hang up with Dr. Arnoldo Morale, the patient left without  instruction.  Colleague from [facility] was here and witnessed the interaction and heard the phone conversation with the patient advocate. Dr. Arnoldo Morale was notified of the advocate stating that she was calling [facility] administration. Will notify Dr. Constance Haw when she returns to clinic on 11-02-2019.

## 2019-11-02 ENCOUNTER — Telehealth: Payer: Self-pay | Admitting: *Deleted

## 2019-11-02 NOTE — Telephone Encounter (Signed)
Noted  

## 2019-11-02 NOTE — Telephone Encounter (Signed)
Patient stopped by office today requesting appointment with PCP. Reports that she is having pain in upper abd/ chest that feels like a band around her midsection.   Reports that she did have surgery on 10/27/2019, and was advised by surgeon to go to ER for pain. Reports that she went to ER, but felt that she was not being cared for. States that she left and was seen at [facility]. EKG performed and no issues found. Patient was advised to come to PCP to request chest scan for possible PE.   Advised to go to ER and stay for evaluation if she is concerned about PE. Reports that she has no SOB.   States that she only requires order for CXR. Appointment scheduled.

## 2019-11-02 NOTE — Telephone Encounter (Signed)
Colleague that heard entire conversation was Lyndal Pulley from [facility].

## 2019-11-03 ENCOUNTER — Encounter: Payer: Self-pay | Admitting: Family Medicine

## 2019-11-03 ENCOUNTER — Other Ambulatory Visit: Payer: Self-pay

## 2019-11-03 ENCOUNTER — Ambulatory Visit (HOSPITAL_COMMUNITY)
Admission: RE | Admit: 2019-11-03 | Discharge: 2019-11-03 | Disposition: A | Discharge status: Home / Self Care | Payer: Medicare Other | Source: Ambulatory Visit | Attending: Family Medicine | Admitting: Family Medicine

## 2019-11-03 ENCOUNTER — Ambulatory Visit (INDEPENDENT_AMBULATORY_CARE_PROVIDER_SITE_OTHER): Payer: Medicare Other | Admitting: Family Medicine

## 2019-11-03 VITALS — BP 136/78 | HR 98 | Temp 97.6°F | Resp 12 | Wt 225.0 lb

## 2019-11-03 DIAGNOSIS — R071 Chest pain on breathing: Secondary | ICD-10-CM

## 2019-11-03 DIAGNOSIS — R0602 Shortness of breath: Secondary | ICD-10-CM | POA: Insufficient documentation

## 2019-11-03 DIAGNOSIS — Z9889 Other specified postprocedural states: Secondary | ICD-10-CM

## 2019-11-03 MED ORDER — IOHEXOL 350 MG/ML SOLN
100.0000 mL | Freq: Once | INTRAVENOUS | Status: AC | PRN
  Administered 2019-11-03: 100 mL via INTRAVENOUS
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
## 2019-11-03 NOTE — Patient Instructions (Signed)
We will call with results F/U pending results  

## 2019-11-04 LAB — COMPREHENSIVE METABOLIC PANEL
AG Ratio: 1.4 (calc) (ref 1.0–2.5)
ALT: 12 U/L (ref 6–29)
AST: 13 U/L (ref 10–30)
Albumin: 4.1 g/dL (ref 3.6–5.1)
Alkaline phosphatase (APISO): 89 U/L (ref 31–125)
BUN: 13 mg/dL (ref 7–25)
CO2: 24 mmol/L (ref 20–32)
Calcium: 9.2 mg/dL (ref 8.6–10.2)
Chloride: 104 mmol/L (ref 98–110)
Creat: 0.68 mg/dL (ref 0.50–1.10)
Globulin: 2.9 g/dL (calc) (ref 1.9–3.7)
Glucose, Bld: 100 mg/dL — ABNORMAL HIGH (ref 65–99)
Potassium: 4.4 mmol/L (ref 3.5–5.3)
Sodium: 137 mmol/L (ref 135–146)
Total Bilirubin: 0.4 mg/dL (ref 0.2–1.2)
Total Protein: 7 g/dL (ref 6.1–8.1)

## 2019-11-04 LAB — CBC WITH DIFFERENTIAL/PLATELET
Absolute Monocytes: 566 cells/uL (ref 200–950)
Basophils Absolute: 51 cells/uL (ref 0–200)
Basophils Relative: 0.5 %
Eosinophils Absolute: 172 cells/uL (ref 15–500)
Eosinophils Relative: 1.7 %
HCT: 33.7 % — ABNORMAL LOW (ref 35.0–45.0)
Hemoglobin: 10.4 g/dL — ABNORMAL LOW (ref 11.7–15.5)
Lymphs Abs: 2202 cells/uL (ref 850–3900)
MCH: 23.9 pg — ABNORMAL LOW (ref 27.0–33.0)
MCHC: 30.9 g/dL — ABNORMAL LOW (ref 32.0–36.0)
MCV: 77.5 fL — ABNORMAL LOW (ref 80.0–100.0)
MPV: 9.9 fL (ref 7.5–12.5)
Monocytes Relative: 5.6 %
Neutro Abs: 7110 cells/uL (ref 1500–7800)
Neutrophils Relative %: 70.4 %
Platelets: 434 10*3/uL — ABNORMAL HIGH (ref 140–400)
RBC: 4.35 10*6/uL (ref 3.80–5.10)
RDW: 15.8 % — ABNORMAL HIGH (ref 11.0–15.0)
Total Lymphocyte: 21.8 %
WBC: 10.1 10*3/uL (ref 3.8–10.8)

## 2019-11-11 ENCOUNTER — Ambulatory Visit: Payer: Medicare Other | Admitting: General Surgery

## 2020-01-09 ENCOUNTER — Other Ambulatory Visit: Payer: Self-pay | Admitting: Family Medicine

## 2020-01-09 DIAGNOSIS — Z8669 Personal history of other diseases of the nervous system and sense organs: Secondary | ICD-10-CM

## 2020-06-06 ENCOUNTER — Encounter (HOSPITAL_COMMUNITY): Payer: Self-pay | Admitting: Emergency Medicine

## 2020-06-06 ENCOUNTER — Emergency Department (HOSPITAL_COMMUNITY)
Admission: EM | Admit: 2020-06-06 | Discharge: 2020-06-07 | Disposition: A | Discharge status: Home / Self Care | Payer: Medicare Other | Attending: Emergency Medicine | Admitting: Emergency Medicine

## 2020-06-06 ENCOUNTER — Other Ambulatory Visit: Payer: Self-pay

## 2020-06-06 ENCOUNTER — Emergency Department (HOSPITAL_COMMUNITY): Payer: Medicare Other

## 2020-06-06 DIAGNOSIS — R7989 Other specified abnormal findings of blood chemistry: Secondary | ICD-10-CM | POA: Diagnosis not present

## 2020-06-06 DIAGNOSIS — R0789 Other chest pain: Secondary | ICD-10-CM | POA: Insufficient documentation

## 2020-06-06 DIAGNOSIS — R079 Chest pain, unspecified: Secondary | ICD-10-CM | POA: Diagnosis not present

## 2020-06-06 DIAGNOSIS — R5383 Other fatigue: Secondary | ICD-10-CM | POA: Insufficient documentation

## 2020-06-06 DIAGNOSIS — R0602 Shortness of breath: Secondary | ICD-10-CM | POA: Diagnosis not present

## 2020-06-06 DIAGNOSIS — R002 Palpitations: Secondary | ICD-10-CM | POA: Insufficient documentation

## 2020-06-06 LAB — BASIC METABOLIC PANEL
Anion gap: 11 (ref 5–15)
BUN: 18 mg/dL (ref 6–20)
CO2: 26 mmol/L (ref 22–32)
Calcium: 9.2 mg/dL (ref 8.9–10.3)
Chloride: 103 mmol/L (ref 98–111)
Creatinine, Ser: 0.74 mg/dL (ref 0.44–1.00)
GFR, Estimated: >60 mL/min (ref >60)
Glucose, Bld: 96 mg/dL (ref 70–99)
Potassium: 3.6 mmol/L (ref 3.5–5.1)
Sodium: 140 mmol/L (ref 135–145)

## 2020-06-06 LAB — CBC
HCT: 34.3 % — ABNORMAL LOW (ref 36.0–46.0)
Hemoglobin: 10 g/dL — ABNORMAL LOW (ref 12.0–15.0)
MCH: 23 pg — ABNORMAL LOW (ref 26.0–34.0)
MCHC: 29.2 g/dL — ABNORMAL LOW (ref 30.0–36.0)
MCV: 78.9 fL — ABNORMAL LOW (ref 80.0–100.0)
Platelets: 412 10*3/uL — ABNORMAL HIGH (ref 150–400)
RBC: 4.35 MIL/uL (ref 3.87–5.11)
RDW: 16.7 % — ABNORMAL HIGH (ref 11.5–15.5)
WBC: 8.7 10*3/uL (ref 4.0–10.5)
nRBC: 0 % (ref 0.0–0.2)

## 2020-06-06 LAB — TROPONIN I (HIGH SENSITIVITY): Troponin I (High Sensitivity): <2 ng/L (ref <18)

## 2020-06-06 LAB — HCG, SERUM, QUALITATIVE: Preg, Serum: NEGATIVE

## 2020-06-06 NOTE — ED Notes (Signed)
Pt ambulated to restroom. 

## 2020-06-06 NOTE — ED Provider Notes (Signed)
[facility] EMERGENCY DEPARTMENT Provider Note   CSN: [id number] Arrival date & time: 06/06/20  2035     History Chief Complaint  Patient presents with  . Chest Pain    Destiny Fischer is a 40 y.o. female.  HPI     This a 40 year old female with a history of Raynaud's, fibromyalgia, bronchitis who presents with palpitations.  Patient reports over the last 1 to 2 weeks she has had increasing palpitations.  She describes something "exploding in my chest and like I am skipping beats."  She has chest pain during these episodes that are intermittent and last for seconds.  Nothing seems to make them better or worse.  No changes in medications.  Denies alcohol or drug use.  She reports associated shortness of breath, generalized fatigue.  Reports no known history of blood clots but did recently have surgery.  She has not noted any lower extremity swelling.  No recent cough or fevers.  No known sick contacts or COVID exposures.  Past Medical History:  Diagnosis Date  . Adjustment disorder   . Anemia   . Anxiety   . Bronchitis   . Chronic hip pain   . Depression   . Dry mouth   . Fibroadenoma of breast, left   . Fibromyalgia   . GERD (gastroesophageal reflux disease)   . IBS (irritable bowel syndrome)   . Menstrual abnormality   . Migraine   . Migraine 08/26/2019  . Poor vision   . Raynaud's phenomenon   . Snoring     Patient Active Problem List   Diagnosis Date Noted  . Fibroadenoma of breast, left 09/15/2019  . Migraine 08/26/2019  . H/O cluster headache 08/26/2019  . BV (bacterial vaginosis) 07/12/2019  . Vaginal itching 07/12/2019  . Left breast mass 07/12/2019  . Vaginal discharge 07/12/2019  . Vaginal odor 07/12/2019  . Hyperlipidemia 06/30/2019  . Iron deficiency anemia 06/30/2019  . H/O fibromyalgia 06/30/2019  . Dryness of vagina 06/30/2019  . Dry eyes 06/30/2019  . Hemangioma of liver 06/18/2017  . Gallbladder polyp 06/18/2017  . Fatty liver 06/18/2017  .  Dysmenorrhea 06/18/2017  . Menorrhagia with irregular cycle 06/18/2017  . RUQ pain 06/18/2017    Past Surgical History:  Procedure Laterality Date  . BREAST BIOPSY Left   . CESAREAN SECTION    . CESAREAN SECTION WITH BILATERAL TUBAL LIGATION    . EXCISION OF BREAST BIOPSY Left 10/27/2019   Procedure: EXCISIONAL BREAST BIOPSY OF LEFT BREAST ;  Surgeon: Virl Cagey, MD;  Location: [facility];  Service: General;  Laterality: Left;     OB History    Gravida  2   Para  2   Term  2   Preterm      AB      Living  2     SAB      IAB      Ectopic      Multiple      Live Births  2           Family History  Problem Relation Age of Onset  . Diabetes Maternal Grandmother   . Heart attack Maternal Grandmother   . Hypertension Mother   . Aneurysm Mother   . Cancer Maternal Aunt        brain  . Autism Son   . Mental illness Daughter     Social History   Tobacco Use  . Smoking status: Never Smoker  . Smokeless tobacco: Never  Used  Vaping Use  . Vaping Use: Never used  Substance Use Topics  . Alcohol use: Yes    Comment: rarely  . Drug use: No    Home Medications Prior to Admission medications   Medication Sig Start Date End Date Taking? Authorizing Provider  docusate sodium (COLACE) 100 MG capsule Take 1 capsule (100 mg total) by mouth 2 (two) times daily as needed (while taking narcotic). 10/27/19 10/26/20  Virl Cagey, MD  ibuprofen (ADVIL) 200 MG tablet Take 400 mg by mouth every 6 (six) hours as needed for moderate pain.     [provider]  naproxen (NAPROSYN) 500 MG tablet TAKE ONE TABLET BY MOUTH AS NEEDED FOR HEADACHE ON A FULL STOMACH 01/10/20   [place], Modena Nunnery, MD  omeprazole (PRILOSEC) 40 MG capsule Take 1 capsule (40 mg total) by mouth daily. 05/18/19   Annie Main, FNP  ondansetron (ZOFRAN) 4 MG tablet Take 1 tablet (4 mg total) by mouth every 8 (eight) hours as needed. 10/27/19 10/26/20  Virl Cagey, MD  oxyCODONE  (ROXICODONE) 5 MG immediate release tablet Take 1 tablet (5 mg total) by mouth every 4 (four) hours as needed for severe pain or breakthrough pain. 10/27/19 10/26/20  Virl Cagey, MD  phenylephrine (SUDAFED PE) 10 MG TABS tablet Take 10 mg by mouth every 4 (four) hours as needed (sinus congestion).    [provider]  UNABLE TO FIND Muscle milk-every other day     [provider]    Allergies    Other and Capsaicin  Review of Systems   Review of Systems  Constitutional: Negative for fever.  Respiratory: Positive for shortness of breath.   Cardiovascular: Positive for chest pain and leg swelling.  Gastrointestinal: Positive for abdominal pain. Negative for nausea and vomiting.  Genitourinary: Negative for dysuria.  Musculoskeletal: Negative for back pain.  Neurological: Negative for light-headedness.  All other systems reviewed and are negative.   Physical Exam Updated Vital Signs BP 123/79   Pulse 88   Temp 98.2 F (36.8 C)   Resp 18   Ht 1.575 m (5\' 2" )   Wt 103.9 kg   LMP 05/15/2020   SpO2 99%   BMI 41.88 kg/m   Physical Exam Vitals and nursing note reviewed.  Constitutional:      Appearance: She is well-developed. She is obese. She is not ill-appearing.  HENT:     Head: Normocephalic and atraumatic.  Eyes:     Pupils: Pupils are equal, round, and reactive to light.  Cardiovascular:     Rate and Rhythm: Normal rate and regular rhythm.     Heart sounds: Normal heart sounds.  Pulmonary:     Effort: Pulmonary effort is normal. No respiratory distress.     Breath sounds: No wheezing.  Chest:     Chest wall: Tenderness present.  Abdominal:     General: Bowel sounds are normal.     Palpations: Abdomen is soft.  Musculoskeletal:     Cervical back: Neck supple.     Right lower leg: No tenderness. No edema.     Left lower leg: No tenderness. No edema.  Skin:    General: Skin is warm and dry.  Neurological:     Mental Status: She is alert and  oriented to person, place, and time.  Psychiatric:        Mood and Affect: Mood normal.     ED Results / Procedures / Treatments   Labs (  all labs ordered are listed, but only abnormal results are displayed) Labs Reviewed  CBC - Abnormal; Notable for the following components:      Result Value   Hemoglobin 10.0 (*)    HCT 34.3 (*)    MCV 78.9 (*)    MCH 23.0 (*)    MCHC 29.2 (*)    RDW 16.7 (*)    Platelets 412 (*)    All other components within normal limits  D-DIMER, QUANTITATIVE - Abnormal; Notable for the following components:   D-Dimer, Quant 0.60 (*)    All other components within normal limits  BASIC METABOLIC PANEL  HCG, SERUM, QUALITATIVE  TSH  TROPONIN I (HIGH SENSITIVITY)  TROPONIN I (HIGH SENSITIVITY)    EKG EKG Interpretation  Date/Time:  Tuesday Jun 06 2020 20:46:45 EDT Ventricular Rate:  91 PR Interval:  160 QRS Duration: 80 QT Interval:  348 QTC Calculation: 428 R Axis:   64 Text Interpretation: Normal sinus rhythm Nonspecific T wave abnormality Abnormal ECG since last tracing no significant change Confirmed by Daleen Bo [phone]) on 06/06/2020 9:37:05 PM   Radiology DG Chest 2 View  Result Date: 06/06/2020 CLINICAL DATA:  Chest pain EXAM: CHEST - 2 VIEW COMPARISON:  None. FINDINGS: The heart size and mediastinal contours are within normal limits. Both lungs are clear. The visualized skeletal structures are unremarkable. IMPRESSION: No active cardiopulmonary disease. Electronically Signed   By: Ulyses Jarred M.D.   On: 06/06/2020 21:27   CT Angio Chest PE W and/or Wo Contrast  Result Date: 06/07/2020 CLINICAL DATA:  Positive D-dimer and shortness of breath EXAM: CT ANGIOGRAPHY CHEST WITH CONTRAST TECHNIQUE: Multidetector CT imaging of the chest was performed using the standard protocol during bolus administration of intravenous contrast. Multiplanar CT image reconstructions and MIPs were obtained to evaluate the vascular anatomy. CONTRAST:  153mL  OMNIPAQUE IOHEXOL 350 MG/ML SOLN COMPARISON:  11/03/2019 FINDINGS: Cardiovascular: Contrast injection is sufficient to demonstrate satisfactory opacification of the pulmonary arteries to the segmental level. There is no pulmonary embolus or evidence of right heart strain. The size of the main pulmonary artery is normal. Heart size is normal, with no pericardial effusion. The course and caliber of the aorta are normal. There is no atherosclerotic calcification. Opacification decreased due to pulmonary arterial phase contrast bolus timing. Mediastinum/Nodes: No mediastinal, hilar or axillary lymphadenopathy. Normal visualized thyroid. Thoracic esophageal course is normal. Lungs/Pleura: Airways are patent. No pleural effusion, lobar consolidation, pneumothorax or pulmonary infarction. Upper Abdomen: Contrast bolus timing is not optimized for evaluation of the abdominal organs. The visualized portions of the organs of the upper abdomen are normal. Musculoskeletal: No chest wall abnormality. No bony spinal canal stenosis. Review of the MIP images confirms the above findings. IMPRESSION: No pulmonary embolus or other acute thoracic abnormality. Electronically Signed   By: Ulyses Jarred M.D.   On: 06/07/2020 02:18    Procedures Procedures   Medications Ordered in ED Medications  iohexol (OMNIPAQUE) 350 MG/ML injection 100 mL (100 mLs Intravenous Contrast Given 06/07/20 0135)    ED Course  I have reviewed the triage vital signs and the nursing notes.  Pertinent labs & imaging results that were available during my care of the patient were reviewed by me and considered in my medical decision making (see chart for details).    MDM Rules/Calculators/A&P                          Patient presents with palpitations and  chest discomfort as well as shortness of breath.  She is overall nontoxic and vital signs are largely reassuring.  EKG without acute arrhythmic or ischemic changes.  Pain is very atypical for ACS.   More described as palpitations.  However, will risk stratify with EKG and troponins.  EKG is reassuring and troponin x2 negative.  Screening D-dimer was sent for evaluation of PE.  D-dimer slightly elevated at 0.6.  For this reason, CT scan was obtained.  This shows no evidence of PE.  Chest x-ray and CT scan also showed no evidence of pneumothorax or pneumonia.  Patient is not hypoxic.  She has not had any infectious symptoms.  Given her constitutional symptoms TSH was sent and is pending.  At this time do not feel she needs further work-up.  Would recommend she follow-up with cardiology as an outpatient for possible Holter monitoring.  After history, exam, and medical workup I feel the patient has been appropriately medically screened and is safe for discharge home. Pertinent diagnoses were discussed with the patient. Patient was given return precautions.  Final Clinical Impression(s) / ED Diagnoses Final diagnoses:  Atypical chest pain  Palpitations    Rx / DC Orders ED Discharge Orders    None       Avir Deruiter, Barbette Hair, MD 06/07/20 [phone]

## 2020-06-06 NOTE — ED Triage Notes (Signed)
Pt c/o her heart "skipping a beat" with SOB x 2 weeks.

## 2020-06-07 ENCOUNTER — Emergency Department (HOSPITAL_COMMUNITY): Payer: Medicare Other

## 2020-06-07 DIAGNOSIS — R7989 Other specified abnormal findings of blood chemistry: Secondary | ICD-10-CM | POA: Diagnosis not present

## 2020-06-07 DIAGNOSIS — R0602 Shortness of breath: Secondary | ICD-10-CM | POA: Diagnosis not present

## 2020-06-07 LAB — TSH: TSH: 1.595 u[IU]/mL (ref 0.350–4.500)

## 2020-06-07 LAB — TROPONIN I (HIGH SENSITIVITY): Troponin I (High Sensitivity): <2 ng/L (ref <18)

## 2020-06-07 LAB — D-DIMER, QUANTITATIVE: D-Dimer, Quant: 0.6 ug/mL-FEU — ABNORMAL HIGH (ref 0.00–0.50)

## 2020-06-07 MED ORDER — IOHEXOL 350 MG/ML SOLN
100.0000 mL | Freq: Once | INTRAVENOUS | Status: AC | PRN
  Administered 2020-06-07: 100 mL via INTRAVENOUS

## 2020-06-07 NOTE — ED Notes (Signed)
Patient transported to CT 

## 2020-06-07 NOTE — ED Notes (Signed)
Pt took blood pressure cuff off, pt states "it gets too tight, and I am not putting it back on"

## 2020-06-07 NOTE — Discharge Instructions (Addendum)
You were seen today for palpitations and chest discomfort.  Your work-up is reassuring including heart testing.  Your CT scan is negative for blood clots.  Thyroid testing is pending.  You should follow-up with cardiology for further evaluation.  You may need Holter monitoring.

## 2020-06-27 ENCOUNTER — Ambulatory Visit (INDEPENDENT_AMBULATORY_CARE_PROVIDER_SITE_OTHER): Payer: Medicare Other | Admitting: Cardiovascular Disease

## 2020-06-27 ENCOUNTER — Encounter: Payer: Self-pay | Admitting: Cardiovascular Disease

## 2020-06-27 ENCOUNTER — Other Ambulatory Visit: Payer: Self-pay

## 2020-06-27 ENCOUNTER — Ambulatory Visit (INDEPENDENT_AMBULATORY_CARE_PROVIDER_SITE_OTHER): Payer: Medicare Other

## 2020-06-27 DIAGNOSIS — R002 Palpitations: Secondary | ICD-10-CM

## 2020-06-27 DIAGNOSIS — R072 Precordial pain: Secondary | ICD-10-CM | POA: Diagnosis not present

## 2020-06-27 DIAGNOSIS — R0789 Other chest pain: Secondary | ICD-10-CM | POA: Insufficient documentation

## 2020-06-27 MED ORDER — METOPROLOL TARTRATE 100 MG PO TABS: 100.0000 mg | ORAL_TABLET | Freq: Once | ORAL | 0 refills | Status: DC

## 2020-06-27 NOTE — Patient Instructions (Signed)
Medication Instructions:  Your physician recommends that you continue on your current medications as directed. Please refer to the Current Medication list given to you today.  *If you need a refill on your cardiac medications before your next appointment, please call your pharmacy*   Lab Work: Your physician recommends that you have labs done today: lipid/liver profile.  Your physician recommends that you return for lab work in: no more than 7 days prior to coronary CTA procedure: BMET  If you have labs (blood work) drawn today and your tests are completely normal, you will receive your results only by: [name] MyChart Message (if you have MyChart) OR . A paper copy in the mail If you have any lab test that is abnormal or we need to change your treatment, we will call you to review the results.   Testing/Procedures: Your physician has requested that you have an echocardiogram. Echocardiography is a painless test that uses sound waves to create images of your heart. It provides your doctor with information about the size and shape of your heart and how well your heart's chambers and valves are working. This procedure takes approximately one hour. There are no restrictions for this procedure. This procedure is done at [address] Monitor Instructions  Your physician has requested you wear a ZIO patch monitor for 14 days.  This is a single patch monitor. [employer] supplies one patch monitor per enrollment. Additional stickers are not available. Please do not apply patch if you will be having a Nuclear Stress Test,  Echocardiogram, Cardiac CT, MRI, or Chest Xray during the period you would be wearing the  monitor. The patch cannot be worn during these tests. You cannot remove and re-apply the  ZIO XT patch monitor.  Your ZIO patch monitor will be mailed 3 day USPS to your address on file. It may take 3-5 days  to receive your monitor after you have been enrolled.   Once you have received your monitor, please review the enclosed instructions. Your monitor  has already been registered assigning a specific monitor serial # to you.  Billing and Patient Assistance Program Information  We have supplied [employer] with any of your insurance information on file for billing purposes. [employer] offers a sliding scale Patient Assistance Program for patients that do not have  insurance, or whose insurance does not completely cover the cost of the ZIO monitor.  You must apply for the Patient Assistance Program to qualify for this discounted rate.  To apply, please call [employer] at [phone], select option 4, select option 2, ask to apply for  Patient Assistance Program. Theodore Demark will ask your household income, and how many people  are in your household. They will quote your out-of-pocket cost based on that information.  [employer] will also be able to set up a 51-month, interest-free payment plan if needed.  Applying the monitor   Shave hair from upper left chest.  Hold abrader disc by orange tab. Rub abrader in 40 strokes over the upper left chest as  indicated in your monitor instructions.  Clean area with 4 enclosed alcohol pads. Let dry.  Apply patch as indicated in monitor instructions. Patch will be placed under collarbone on left  side of chest with arrow pointing upward.  Rub patch adhesive wings for 2 minutes. Remove white label marked "1". Remove the white  label marked "2". Rub patch adhesive wings for 2 additional minutes.  While looking in a  mirror, press and release button in center of patch. A small green light will  flash 3-4 times. This will be your only indicator that the monitor has been turned on.  Do not shower for the first 24 hours. You may shower after the first 24 hours.  Press the button if you feel a symptom. You will hear a small click. Record Date, Time and  Symptom in the Patient Logbook.  When you are ready to remove the patch, follow  instructions on the last 2 pages of Patient  Logbook. Stick patch monitor onto the last page of Patient Logbook.  Place Patient Logbook in the blue and white box. Use locking tab on box and tape box closed  securely. The blue and white box has prepaid postage on it. Please place it in the mailbox as  soon as possible. Your physician should have your test results approximately 7 days after the  monitor has been mailed back to [facility].  Call [place] at [phone] if you have questions regarding  your ZIO XT patch monitor. Call them immediately if you see an orange light blinking on your  monitor.  If your monitor falls off in less than 4 days, contact our Monitor department at [phone].  If your monitor becomes loose or falls off after 4 days call [employer] at [phone] for  suggestions on securing your monitor  Your cardiac CT will be scheduled at one of the below locations:   [facility] [address] [place], [place] [address] [phone]  If scheduled at [facility], please arrive at the [facility] main entrance (entrance A) of [facility] 30 minutes prior to test start time. Proceed to the [facility] Radiology Department (first floor) to check-in and test prep.  Please follow these instructions carefully (unless otherwise directed):  On the Night Before the Test: . Be sure to Drink plenty of water. . Do not consume any caffeinated/decaffeinated beverages or chocolate 12 hours prior to your test. . Do not take any antihistamines 12 hours prior to your test.  On the Day of the Test: . Drink plenty of water until 1 hour prior to the test. . Do not eat any food 4 hours prior to the test. . You may take your regular medications prior to the test.  . Take metoprolol (Lopressor) 100mg  two hours prior to test. . HOLD Furosemide/Hydrochlorothiazide morning of the test. . FEMALES- please wear underwire-free bra if  available       After the Test: . Drink plenty of water. . After receiving IV contrast, you may experience a mild flushed feeling. This is normal. . On occasion, you may experience a mild rash up to 24 hours after the test. This is not dangerous. If this occurs, you can take Benadryl 25 mg and increase your fluid intake. . If you experience trouble breathing, this can be serious. If it is severe call 911 IMMEDIATELY. If it is mild, please call our office. . If you take any of these medications: Glipizide/Metformin, Avandament, Glucavance, please do not take 48 hours after completing test unless otherwise instructed.   Once we have confirmed authorization from your insurance company, we will call you to set up a date and time for your test. Based on how quickly your insurance processes prior authorizations requests, please allow up to 4 weeks to be contacted for scheduling your Cardiac CT appointment. Be advised that routine Cardiac CT appointments could be scheduled as  many as 8 weeks after your provider has ordered it.  For non-scheduling related questions, please contact the cardiac imaging nurse navigator should you have any questions/concerns: Marchia Bond, Cardiac Imaging Nurse Navigator Gordy Clement, Cardiac Imaging Nurse Navigator [facility] Direct Office Dial: [phone]   For scheduling needs, including cancellations and rescheduling, please call [place], [phone].    Follow-Up: At [facility], you and your health needs are our priority.  As part of our continuing mission to provide you with exceptional heart care, we have created designated Provider Care Teams.  These Care Teams include your primary Cardiologist (physician) and Advanced Practice Providers (APPs -  Physician Assistants and Nurse Practitioners) who all work together to provide you with the care you need, when you need it.  We recommend signing up for the patient portal called  "MyChart".  Sign up information is provided on this After Visit Summary.  MyChart is used to connect with patients for Virtual Visits (Telemedicine).  Patients are able to view lab/test results, encounter notes, upcoming appointments, etc.  Non-urgent messages can be sent to your provider as well.   To learn more about what you can do with MyChart, go to [email or web address].    Your next appointment:   8 week(s)  The format for your next appointment:   In Person  Provider:   Quay Burow, MD

## 2020-06-27 NOTE — Progress Notes (Unsigned)
Patient enrolled for [employer] to mail a 14 day ZIO XT holter monitor to address on record.
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
## 2020-06-27 NOTE — Assessment & Plan Note (Signed)
Patient complains of daily palpitations.  Recent TSH was normal.  She is under a lot of stress.  I am going to get a 2-week Zio patch to further evaluate.

## 2020-06-27 NOTE — Assessment & Plan Note (Signed)
Destiny Fischer recently was in the ER several weeks ago with palpitations and atypical chest pain.  Work-up was unremarkable.  She has essentially no cardiac risk factors.  She is under a lot of stress and has diagnosis of depression and fibromyalgia.  I am going to get a 2D echo and a coronary CTA to further evaluate.

## 2020-06-28 LAB — LIPID PANEL
Chol/HDL Ratio: 3.7 ratio (ref 0.0–4.4)
Cholesterol, Total: 190 mg/dL (ref 100–199)
HDL: 51 mg/dL (ref >39)
LDL Chol Calc (NIH): 123 mg/dL — ABNORMAL HIGH (ref 0–99)
Triglycerides: 90 mg/dL (ref 0–149)
VLDL Cholesterol Cal: 16 mg/dL (ref 5–40)

## 2020-06-28 LAB — HEPATIC FUNCTION PANEL
ALT: 10 IU/L (ref 0–32)
AST: 11 IU/L (ref 0–40)
Albumin: 4.5 g/dL (ref 3.8–4.8)
Alkaline Phosphatase: 106 IU/L (ref 44–121)
Bilirubin Total: 0.2 mg/dL (ref 0.0–1.2)
Bilirubin, Direct: <0.1 mg/dL (ref 0.00–0.40)
Total Protein: 7 g/dL (ref 6.0–8.5)

## 2020-07-04 DIAGNOSIS — R002 Palpitations: Secondary | ICD-10-CM

## 2020-07-05 ENCOUNTER — Telehealth: Payer: Self-pay | Admitting: Cardiovascular Disease

## 2020-07-05 NOTE — Telephone Encounter (Signed)
Spoke with pt regarding zio heart monitor.  Pt placed monitor yesterday and was unable to continue to wear monitor due to allergic reaction to adhesive on monitor. Pt called the company and let me know and they told her to make cardiology office aware. She will mail back monitor and we will wait for results. Pt verbalizes understanding.

## 2020-07-05 NOTE — Telephone Encounter (Signed)
PT is calling wanting to confirm that she needs to mail the heart monitor back in.She states that she had a allergic reaction to the gel to apply the monitor

## 2020-07-11 DIAGNOSIS — R002 Palpitations: Secondary | ICD-10-CM | POA: Diagnosis not present

## 2020-07-12 ENCOUNTER — Ambulatory Visit (HOSPITAL_COMMUNITY): Payer: Medicare Other

## 2020-07-13 ENCOUNTER — Other Ambulatory Visit (HOSPITAL_COMMUNITY): Payer: Medicare Other

## 2020-07-18 ENCOUNTER — Telehealth (HOSPITAL_COMMUNITY): Payer: Self-pay | Admitting: Emergency Medicine

## 2020-07-18 ENCOUNTER — Other Ambulatory Visit: Payer: Self-pay

## 2020-07-18 ENCOUNTER — Other Ambulatory Visit (HOSPITAL_COMMUNITY): Payer: Self-pay | Admitting: Emergency Medicine

## 2020-07-18 DIAGNOSIS — R0789 Other chest pain: Secondary | ICD-10-CM

## 2020-07-18 MED ORDER — METOPROLOL SUCCINATE ER 25 MG PO TB24: 25.0000 mg | ORAL_TABLET | Freq: Every day | ORAL | 3 refills | Status: DC

## 2020-07-18 MED ORDER — IVABRADINE HCL 5 MG PO TABS: 10.0000 mg | ORAL_TABLET | Freq: Once | ORAL | 0 refills | Status: AC

## 2020-07-18 NOTE — Progress Notes (Signed)
Spoke to patient Dr.Berry advised to take Toprol XL 25 mg daily for palpitations.

## 2020-07-18 NOTE — Progress Notes (Signed)
10mg  ivabradine one time dose added for patient to take in addition to 100mg  metoprolol tartrate for upcoming coronary CTA.  Marchia Bond RN Navigator Cardiac Imaging [facility] [phone] Office  [phone] Cell

## 2020-07-18 NOTE — Telephone Encounter (Signed)
Unable to leave vm Aveon Colquhoun RN Navigator Cardiac Imaging [facility] [phone] Office  [phone] Cell  

## 2020-07-19 ENCOUNTER — Other Ambulatory Visit: Payer: Self-pay

## 2020-07-19 ENCOUNTER — Ambulatory Visit (HOSPITAL_COMMUNITY)
Admission: RE | Admit: 2020-07-19 | Discharge: 2020-07-19 | Disposition: A | Discharge status: Home / Self Care | Payer: Medicare Other | Source: Ambulatory Visit | Attending: Cardiovascular Disease | Admitting: Cardiovascular Disease

## 2020-07-19 DIAGNOSIS — R002 Palpitations: Secondary | ICD-10-CM | POA: Diagnosis not present

## 2020-07-19 DIAGNOSIS — R072 Precordial pain: Secondary | ICD-10-CM | POA: Diagnosis not present

## 2020-07-19 DIAGNOSIS — R0789 Other chest pain: Secondary | ICD-10-CM | POA: Diagnosis not present

## 2020-07-19 LAB — ECHOCARDIOGRAM COMPLETE
Area-P 1/2: 3.93 cm2
S' Lateral: 2.9 cm

## 2020-07-19 NOTE — Progress Notes (Signed)
*  PRELIMINARY RESULTS* Echocardiogram 2D Echocardiogram has been performed.  Samuel Germany 07/19/2020, 12:19 PM

## 2020-07-20 ENCOUNTER — Ambulatory Visit (HOSPITAL_COMMUNITY)
Admission: RE | Admit: 2020-07-20 | Discharge: 2020-07-20 | Disposition: A | Discharge status: Home / Self Care | Payer: Medicare Other | Source: Ambulatory Visit | Attending: Cardiovascular Disease | Admitting: Cardiovascular Disease

## 2020-07-20 DIAGNOSIS — R072 Precordial pain: Secondary | ICD-10-CM | POA: Diagnosis not present

## 2020-07-20 MED ORDER — METOPROLOL TARTRATE 5 MG/5ML IV SOLN
INTRAVENOUS | Status: AC
  Filled 2020-07-20: qty 15

## 2020-07-20 MED ORDER — NITROGLYCERIN 0.4 MG SL SUBL
0.8000 mg | SUBLINGUAL_TABLET | Freq: Once | SUBLINGUAL | Status: AC
  Administered 2020-07-20: 0.8 mg via SUBLINGUAL

## 2020-07-20 MED ORDER — IOHEXOL 350 MG/ML SOLN
95.0000 mL | Freq: Once | INTRAVENOUS | Status: AC | PRN
  Administered 2020-07-20: 95 mL via INTRAVENOUS

## 2020-07-20 MED ORDER — METOPROLOL TARTRATE 5 MG/5ML IV SOLN
5.0000 mg | INTRAVENOUS | Status: DC | PRN
  Administered 2020-07-20 (×2): 5 mg via INTRAVENOUS

## 2020-07-20 MED ORDER — NITROGLYCERIN 0.4 MG SL SUBL
SUBLINGUAL_TABLET | SUBLINGUAL | Status: AC
  Filled 2020-07-20: qty 2

## 2020-07-21 ENCOUNTER — Encounter: Payer: Self-pay | Admitting: *Deleted

## 2020-07-21 ENCOUNTER — Telehealth: Payer: Self-pay | Admitting: Cardiovascular Disease

## 2020-07-21 NOTE — Telephone Encounter (Signed)
Spoke to patient echo results given. 

## 2020-07-21 NOTE — Telephone Encounter (Signed)
Patient was returning call for results 

## 2020-08-11 DIAGNOSIS — G4733 Obstructive sleep apnea (adult) (pediatric): Secondary | ICD-10-CM | POA: Diagnosis not present

## 2020-08-23 ENCOUNTER — Ambulatory Visit
Admission: EM | Admit: 2020-08-23 | Discharge: 2020-08-23 | Disposition: A | Discharge status: Home / Self Care | Payer: Medicare Other | Attending: Family Medicine | Admitting: Family Medicine

## 2020-08-23 ENCOUNTER — Encounter: Payer: Self-pay | Admitting: Emergency Medicine

## 2020-08-23 DIAGNOSIS — R21 Rash and other nonspecific skin eruption: Secondary | ICD-10-CM | POA: Diagnosis not present

## 2020-08-23 MED ORDER — TRIAMCINOLONE ACETONIDE 0.1 % EX CREA: 1.0000 "application " | TOPICAL_CREAM | Freq: Two times a day (BID) | CUTANEOUS | 0 refills | Status: DC

## 2020-08-23 NOTE — ED Triage Notes (Signed)
Rash on legs, face, neck x 4 days.  States rash itches.

## 2020-08-24 NOTE — ED Provider Notes (Signed)
[facility]   [id number] 08/23/20 Arrival Time: [place]:  1. Rash and nonspecific skin eruption    Begin: Meds ordered this encounter  Medications   triamcinolone cream (KENALOG) 0.1 %    Sig: Apply 1 application topically 2 (two) times daily.    Dispense:  30 g    Refill:  0   No signs of bacterial skin infection. Will follow up with PCP or here if worsening or failing to improve as anticipated. Reviewed expectations re: course of current medical issues. Questions answered. Outlined signs and symptoms indicating need for more acute intervention. Patient verbalized understanding. After Visit Summary given.   SUBJECTIVE:  Destiny Fischer is a 40 y.o. female who presents with a skin complaint. Itchy rash on legs mainly; x 4 days; some on chin now. Unknown exposure. No pain. Afebrile.   OBJECTIVE: Vitals:   08/23/20 1614  BP: 122/87  Pulse: (!) 120  Resp: 16  Temp: 98.5 F (36.9 C)  TempSrc: Oral  SpO2: 95%    Recheck P: 102  General appearance: alert; no distress HEENT: [place]; AT Neck: supple with FROM Extremities: no edema; moves all extremities normally Skin: warm and dry; scattered patches or erythema over inner legs; small place on chin Psychological: alert and cooperative; normal mood and affect  Allergies  Allergen Reactions   Other Anaphylaxis    peppers   Capsaicin     Past Medical History:  Diagnosis Date   Adjustment disorder    Anemia    Anxiety    Bronchitis    Chronic hip pain    Depression    Dry mouth    Fibroadenoma of breast, left    Fibromyalgia    GERD (gastroesophageal reflux disease)    IBS (irritable bowel syndrome)    Menstrual abnormality    Migraine    Migraine 08/26/2019   Poor vision    Raynaud's phenomenon    Snoring    Social History   Socioeconomic History   Marital status: Legally Separated    Spouse name: Not on file   Number of children: Not on file   Years of education: Not on file    Highest education level: Not on file  Occupational History   Not on file  Tobacco Use   Smoking status: Never   Smokeless tobacco: Never  Vaping Use   Vaping Use: Never used  Substance and Sexual Activity   Alcohol use: Yes    Comment: rarely   Drug use: No   Sexual activity: Not Currently    Birth control/protection: Surgical    Comment: tubal  Other Topics Concern   Not on file  Social History Narrative   Not on file   Social Determinants of Health   Financial Resource Strain: Not on file  Food Insecurity: Not on file  Transportation Needs: Not on file  Physical Activity: Not on file  Stress: Not on file  Social Connections: Not on file  Intimate Partner Violence: Not on file   Family History  Problem Relation Age of Onset   Diabetes Maternal Grandmother    Heart attack Maternal Grandmother    Hypertension Mother    Aneurysm Mother    Cancer Maternal Aunt        brain   Autism Son    Mental illness Daughter    Past Surgical History:  Procedure Laterality Date   BREAST BIOPSY Left    CESAREAN SECTION     CESAREAN SECTION  WITH BILATERAL TUBAL LIGATION     EXCISION OF BREAST BIOPSY Left 10/27/2019   Procedure: EXCISIONAL BREAST BIOPSY OF LEFT BREAST ;  Surgeon: Virl Cagey, MD;  Location: [facility];  Service: General;  Laterality: Left;      Vanessa Kick, MD 08/24/20 1055

## 2020-09-13 ENCOUNTER — Ambulatory Visit: Payer: Medicare Other | Admitting: Cardiovascular Disease

## 2021-03-23 ENCOUNTER — Ambulatory Visit (INDEPENDENT_AMBULATORY_CARE_PROVIDER_SITE_OTHER): Payer: Medicare Other | Admitting: Podiatry

## 2021-03-23 ENCOUNTER — Ambulatory Visit: Payer: Medicare Other

## 2021-03-23 ENCOUNTER — Other Ambulatory Visit: Payer: Self-pay

## 2021-03-23 DIAGNOSIS — L600 Ingrowing nail: Secondary | ICD-10-CM | POA: Diagnosis not present

## 2021-03-23 DIAGNOSIS — L853 Xerosis cutis: Secondary | ICD-10-CM | POA: Diagnosis not present

## 2021-03-23 DIAGNOSIS — K769 Liver disease, unspecified: Secondary | ICD-10-CM | POA: Diagnosis not present

## 2021-03-23 DIAGNOSIS — M778 Other enthesopathies, not elsewhere classified: Secondary | ICD-10-CM

## 2021-03-23 DIAGNOSIS — M79672 Pain in left foot: Secondary | ICD-10-CM

## 2021-03-23 MED ORDER — CASTELLANI PAINT 1.5 % EX LIQD: 1.0000 "application " | Freq: Every day | CUTANEOUS | 0 refills | Status: AC

## 2021-03-23 MED ORDER — DICLOFENAC SODIUM 1 % EX GEL: 2.0000 g | Freq: Four times a day (QID) | CUTANEOUS | 2 refills | Status: AC

## 2021-03-23 MED ORDER — UREA 50 % EX CREA: 1.0000 "application " | TOPICAL_CREAM | Freq: Every day | CUTANEOUS | 1 refills | Status: AC | PRN

## 2021-03-24 LAB — COMPLETE METABOLIC PANEL WITH GFR
AG Ratio: 1.4 (calc) (ref 1.0–2.5)
ALT: 9 U/L (ref 6–29)
AST: 13 U/L (ref 10–30)
Albumin: 4 g/dL (ref 3.6–5.1)
Alkaline phosphatase (APISO): 79 U/L (ref 31–125)
BUN: 13 mg/dL (ref 7–25)
CO2: 24 mmol/L (ref 20–32)
Calcium: 9.1 mg/dL (ref 8.6–10.2)
Chloride: 105 mmol/L (ref 98–110)
Creat: 0.72 mg/dL (ref 0.50–0.99)
Globulin: 2.9 g/dL (calc) (ref 1.9–3.7)
Glucose, Bld: 99 mg/dL (ref 65–99)
Potassium: 4.3 mmol/L (ref 3.5–5.3)
Sodium: 139 mmol/L (ref 135–146)
Total Bilirubin: 0.4 mg/dL (ref 0.2–1.2)
Total Protein: 6.9 g/dL (ref 6.1–8.1)
eGFR: 106 mL/min/{1.73_m2} (ref >=60)

## 2021-03-28 ENCOUNTER — Ambulatory Visit (INDEPENDENT_AMBULATORY_CARE_PROVIDER_SITE_OTHER): Payer: Medicare Other | Admitting: Family Medicine

## 2021-03-28 ENCOUNTER — Encounter: Payer: Self-pay | Admitting: Family Medicine

## 2021-03-28 ENCOUNTER — Other Ambulatory Visit: Payer: Self-pay

## 2021-03-28 VITALS — BP 112/76 | HR 98 | Temp 98.5°F | Wt 224.4 lb

## 2021-03-28 DIAGNOSIS — D509 Iron deficiency anemia, unspecified: Secondary | ICD-10-CM | POA: Diagnosis not present

## 2021-03-28 DIAGNOSIS — M797 Fibromyalgia: Secondary | ICD-10-CM

## 2021-03-28 DIAGNOSIS — F4310 Post-traumatic stress disorder, unspecified: Secondary | ICD-10-CM | POA: Diagnosis not present

## 2021-03-28 DIAGNOSIS — E782 Mixed hyperlipidemia: Secondary | ICD-10-CM | POA: Diagnosis not present

## 2021-03-28 DIAGNOSIS — M3500 Sicca syndrome, unspecified: Secondary | ICD-10-CM | POA: Diagnosis not present

## 2021-03-28 NOTE — Patient Instructions (Signed)
Labs today. ? ?I have placed referrals. ? ?Follow up in 3 months. ?

## 2021-03-29 ENCOUNTER — Other Ambulatory Visit: Payer: Self-pay | Admitting: Family Medicine

## 2021-03-29 DIAGNOSIS — M797 Fibromyalgia: Secondary | ICD-10-CM | POA: Insufficient documentation

## 2021-03-29 DIAGNOSIS — F4310 Post-traumatic stress disorder, unspecified: Secondary | ICD-10-CM | POA: Insufficient documentation

## 2021-03-29 DIAGNOSIS — M3500 Sicca syndrome, unspecified: Secondary | ICD-10-CM | POA: Insufficient documentation

## 2021-03-29 LAB — LIPID PANEL
Chol/HDL Ratio: 3.8 ratio (ref 0.0–4.4)
Cholesterol, Total: 200 mg/dL — ABNORMAL HIGH (ref 100–199)
HDL: 52 mg/dL (ref >39)
LDL Chol Calc (NIH): 121 mg/dL — ABNORMAL HIGH (ref 0–99)
Triglycerides: 155 mg/dL — ABNORMAL HIGH (ref 0–149)
VLDL Cholesterol Cal: 27 mg/dL (ref 5–40)

## 2021-03-29 LAB — CBC
Hematocrit: 35.9 % (ref 34.0–46.6)
Hemoglobin: 11 g/dL — ABNORMAL LOW (ref 11.1–15.9)
MCH: 23.2 pg — ABNORMAL LOW (ref 26.6–33.0)
MCHC: 30.6 g/dL — ABNORMAL LOW (ref 31.5–35.7)
MCV: 76 fL — ABNORMAL LOW (ref 79–97)
Platelets: 508 10*3/uL — ABNORMAL HIGH (ref 150–450)
RBC: 4.75 x10E6/uL (ref 3.77–5.28)
RDW: 16.5 % — ABNORMAL HIGH (ref 11.7–15.4)
WBC: 8.8 10*3/uL (ref 3.4–10.8)

## 2021-03-29 LAB — IRON,TIBC AND FERRITIN PANEL
Ferritin: 10 ng/mL — ABNORMAL LOW (ref 15–150)
Iron Saturation: 5 % — CL (ref 15–55)
Iron: 22 ug/dL — ABNORMAL LOW (ref 27–159)
Total Iron Binding Capacity: 451 ug/dL — ABNORMAL HIGH (ref 250–450)
UIBC: 429 ug/dL — ABNORMAL HIGH (ref 131–425)

## 2021-03-29 MED ORDER — IRON (FERROUS SULFATE) 325 (65 FE) MG PO TABS: 325.0000 mg | ORAL_TABLET | ORAL | 3 refills | Status: AC

## 2021-03-29 NOTE — Assessment & Plan Note (Signed)
History of iron deficiency anemia.  Labs for further evaluation today. ?

## 2021-03-29 NOTE — Assessment & Plan Note (Signed)
Referring to behavioral health. ?
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
## 2021-03-29 NOTE — Assessment & Plan Note (Signed)
Lipid panel for further evaluation. ?

## 2021-03-29 NOTE — Assessment & Plan Note (Signed)
Reported history of Sjogren's syndrome as well as fibromyalgia.  Referring to rheumatology. ?

## 2021-03-29 NOTE — Assessment & Plan Note (Deleted)
Labs today

## 2021-03-29 NOTE — Assessment & Plan Note (Signed)
Discussed the possibility of medications for treatment of fibromyalgia.  Also discussed seeing counselor/psychologist for therapy/cognitive behavioral therapy.  Referral placed to behavioral health.  I believe that this is multifactorial and worsened in the setting of PTSD. ?

## 2021-03-30 ENCOUNTER — Telehealth: Payer: Self-pay

## 2021-03-30 ENCOUNTER — Other Ambulatory Visit: Payer: Self-pay | Admitting: Family Medicine

## 2021-03-30 MED ORDER — RIZATRIPTAN BENZOATE 10 MG PO TABS: 10.0000 mg | ORAL_TABLET | ORAL | 6 refills | Status: AC | PRN

## 2021-03-30 NOTE — Telephone Encounter (Signed)
Patient states has no vision changes or nausea , has a hx of migraines and normally takes naproxen and deep sleeps for relief. Her pain is located on the R side of her neck and goes to the R eye in a line. She feels it is affecting her cognitively, going on for 2 weeks . Does not see a specialist.  ?

## 2021-03-30 NOTE — Progress Notes (Signed)
Called patient to give results, no answer,could not leave vmessage, mailbox full. Will try again later.

## 2021-03-30 NOTE — Telephone Encounter (Signed)
Pt has been informed per new orders.  ?
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
## 2021-04-02 NOTE — Progress Notes (Signed)
See phone note

## 2021-05-09 ENCOUNTER — Telehealth: Payer: Self-pay | Admitting: *Deleted

## 2021-05-09 NOTE — Telephone Encounter (Signed)
-----   Message from Trula Slade, DPM sent at 03/26/2021  5:31 PM EST ----- ?Destiny Fischer- can you please let her know that the blood work is normal, in particular her liver function is normal.  ?

## 2021-05-09 NOTE — Telephone Encounter (Signed)
Patient has been notified of normal labs, verbalized understanding. ?

## 2021-05-22 ENCOUNTER — Ambulatory Visit: Payer: Medicare Other | Admitting: Podiatry

## 2021-06-11 ENCOUNTER — Encounter: Payer: Self-pay | Admitting: Emergency Medicine

## 2021-06-11 ENCOUNTER — Ambulatory Visit
Admission: EM | Admit: 2021-06-11 | Discharge: 2021-06-11 | Disposition: A | Discharge status: Home / Self Care | Payer: Medicare Other | Attending: Nurse Practitioner | Admitting: Nurse Practitioner

## 2021-06-11 DIAGNOSIS — J209 Acute bronchitis, unspecified: Secondary | ICD-10-CM | POA: Diagnosis not present

## 2021-06-11 MED ORDER — PREDNISONE 20 MG PO TABS: 40.0000 mg | ORAL_TABLET | Freq: Every day | ORAL | 0 refills | Status: AC

## 2021-06-11 MED ORDER — AZITHROMYCIN 250 MG PO TABS: 250.0000 mg | ORAL_TABLET | Freq: Every day | ORAL | 0 refills | Status: DC

## 2021-06-11 MED ORDER — ALBUTEROL SULFATE HFA 108 (90 BASE) MCG/ACT IN AERS: 2.0000 | INHALATION_SPRAY | Freq: Four times a day (QID) | RESPIRATORY_TRACT | 0 refills | Status: DC | PRN

## 2021-06-11 MED ORDER — PROMETHAZINE-DM 6.25-15 MG/5ML PO SYRP: 5.0000 mL | ORAL_SOLUTION | Freq: Four times a day (QID) | ORAL | 0 refills | Status: DC | PRN

## 2021-06-11 NOTE — ED Triage Notes (Signed)
Hx of bronchitis at the end of April.  Was only able to take half of medication due to money issues.  Symptoms never went away.

## 2021-06-11 NOTE — Discharge Instructions (Signed)
Take medication as prescribed. Increase fluids and allow for plenty of rest. May take ibuprofen or Tylenol for pain, fever, or general discomfort. Recommend using a humidifier at bedtime to help with cough. Sleep elevated on 2 pillows to help with cough.  If your cough worsens, or does not improve after this treatment, follow-up in our clinic.

## 2021-06-11 NOTE — ED Provider Notes (Signed)
[facility]    CSN: [id number] Arrival date & time: 06/11/21  1819      History   Chief Complaint No chief complaint on file.   HPI Destiny Fischer is a 41 y.o. female.   The patient is a 41 year old female who presents with cough.  Patient states symptoms have been present for the past month.  She states that she and her boyfriend were sick at the same time, and due to many issues, they split the prescription that he had.  She states neither of them took a complete prescription.  She denies fever, chills, chest pain, shortness of breath, or difficulty breathing.  She states that the cough is worse at night.  Cough is not productive.  Patient states she has been using cough drops and her humidifier at bedtime for her symptoms.  The history is provided by the patient.   Past Medical History:  Diagnosis Date   Adjustment disorder    Anemia    Anxiety    Bronchitis    Chronic hip pain    Depression    Dry mouth    Fibroadenoma of breast, left    Fibromyalgia    GERD (gastroesophageal reflux disease)    IBS (irritable bowel syndrome)    Menstrual abnormality    Migraine    Migraine 08/26/2019   Poor vision    Raynaud's phenomenon    Snoring     Patient Active Problem List   Diagnosis Date Noted   Fibromyalgia 03/29/2021   Sjogren's syndrome ([place]) 03/29/2021   PTSD (post-traumatic stress disorder) 03/29/2021   Fibroadenoma of breast, left 09/15/2019   Migraine 08/26/2019   Hyperlipidemia 06/30/2019   Iron deficiency anemia 06/30/2019   Hemangioma of liver 06/18/2017    Past Surgical History:  Procedure Laterality Date   BREAST BIOPSY Left    CESAREAN SECTION     CESAREAN SECTION WITH BILATERAL TUBAL LIGATION     EXCISION OF BREAST BIOPSY Left 10/27/2019   Procedure: EXCISIONAL BREAST BIOPSY OF LEFT BREAST ;  Surgeon: Virl Cagey, MD;  Location: [facility];  Service: General;  Laterality: Left;    OB History     Gravida  2   Para  2   Term  2    Preterm      AB      Living  2      SAB      IAB      Ectopic      Multiple      Live Births  2            Home Medications    Prior to Admission medications   Medication Sig Start Date End Date Taking? Authorizing Provider  albuterol (VENTOLIN HFA) 108 (90 Base) MCG/ACT inhaler Inhale 2 puffs into the lungs every 6 (six) hours as needed for wheezing or shortness of breath. 06/11/21  Yes Ghadeer Kastelic-Warren, Alda Lea, NP  azithromycin (ZITHROMAX) 250 MG tablet Take 1 tablet (250 mg total) by mouth daily. Take first 2 tablets together, then 1 every day until finished. 06/11/21  Yes Ignacio Lowder-Warren, Alda Lea, NP  predniSONE (DELTASONE) 20 MG tablet Take 2 tablets (40 mg total) by mouth daily with breakfast for 5 days. 06/11/21 06/16/21 Yes Zuria Fosdick-Warren, Alda Lea, NP  promethazine-dextromethorphan (PROMETHAZINE-DM) 6.25-15 MG/5ML syrup Take 5 mLs by mouth 4 (four) times daily as needed for cough. 06/11/21  Yes Jamye Balicki-Warren, Alda Lea, NP  Candee Furbish Paint 1.5 % LIQD Apply 1 application topically  daily. 03/23/21   Trula Slade, DPM  diclofenac Sodium (VOLTAREN) 1 % GEL Apply 2 g topically 4 (four) times daily. Rub into affected area of foot 2 to 4 times daily 03/23/21   Trula Slade, DPM  Iron, Ferrous Sulfate, 325 (65 Fe) MG TABS Take 325 mg by mouth every other day. 03/29/21   Coral Spikes, DO  rizatriptan (MAXALT) 10 MG tablet Take 1 tablet (10 mg total) by mouth as needed for migraine. May repeat additional dose in 2 hours if needed 03/30/21   Coral Spikes, DO  Urea 50 % CREA Apply 1 application topically daily as needed. 03/23/21   Trula Slade, DPM    Family History Family History  Problem Relation Age of Onset   Diabetes Maternal Grandmother    Heart attack Maternal Grandmother    Hypertension Mother    Aneurysm Mother    Cancer Maternal Aunt        brain   Autism Son    Mental illness Daughter     Social History Social History   Tobacco Use   Smoking  status: Never   Smokeless tobacco: Never  Vaping Use   Vaping Use: Never used  Substance Use Topics   Alcohol use: Yes    Comment: rarely   Drug use: No     Allergies   Other and Capsaicin   Review of Systems Review of Systems PER HPI  Physical Exam Triage Vital Signs ED Triage Vitals  Enc Vitals Group     BP 06/11/21 1926 (!) 149/92     Pulse Rate 06/11/21 1926 89     Resp 06/11/21 1926 18     Temp 06/11/21 1926 98.3 F (36.8 C)     Temp Source 06/11/21 1926 Oral     SpO2 06/11/21 1926 97 %     Weight --      Height --      Head Circumference --      Peak Flow --      Pain Score 06/11/21 1928 0     Pain Loc --      Pain Edu? --      Excl. in [place]? --    No data found.  Updated Vital Signs BP (!) 149/92 (BP Location: Right Arm)   Pulse 89   Temp 98.3 F (36.8 C) (Oral)   Resp 18   LMP 05/28/2021 (Approximate)   SpO2 97%   Visual Acuity Right Eye Distance:   Left Eye Distance:   Bilateral Distance:    Right Eye Near:   Left Eye Near:    Bilateral Near:     Physical Exam Vitals and nursing note reviewed.  Constitutional:      Appearance: Normal appearance.  HENT:     Head: Normocephalic.     Right Ear: Tympanic membrane, ear canal and external ear normal.     Left Ear: Tympanic membrane, ear canal and external ear normal.     Nose: Nose normal.     Mouth/Throat:     Mouth: Mucous membranes are moist.  Eyes:     Extraocular Movements: Extraocular movements intact.     Conjunctiva/sclera: Conjunctivae normal.     Pupils: Pupils are equal, round, and reactive to light.  Cardiovascular:     Rate and Rhythm: Normal rate and regular rhythm.     Pulses: Normal pulses.     Heart sounds: Normal heart sounds.  Pulmonary:     Effort: Pulmonary  effort is normal. No respiratory distress.     Breath sounds: Normal breath sounds. No wheezing or rales.  Chest:     Chest wall: No tenderness.  Abdominal:     General: Bowel sounds are normal.      Palpations: Abdomen is soft.  Musculoskeletal:     Cervical back: Normal range of motion.  Skin:    General: Skin is warm and dry.     Capillary Refill: Capillary refill takes less than 2 seconds.  Neurological:     General: No focal deficit present.     Mental Status: She is alert and oriented to person, place, and time.  Psychiatric:        Mood and Affect: Mood normal.        Behavior: Behavior normal.     UC Treatments / Results  Labs (all labs ordered are listed, but only abnormal results are displayed) Labs Reviewed - No data to display  EKG   Radiology No results found.  Procedures Procedures (including critical care time)  Medications Ordered in UC Medications - No data to display  Initial Impression / Assessment and Plan / UC Course  I have reviewed the triage vital signs and the nursing notes.  Pertinent labs & imaging results that were available during my care of the patient were reviewed by me and considered in my medical decision making (see chart for details).  Patient is a 41 year old female who presents with cough for 1 month.  Patient was diagnosed with bronchitis approximately 1 month ago but did not complete her medication regimen.  She states the cough has remained persistent.  She denies any other systemic symptoms such as fever, chills, shortness of breath, or difficulty breathing.  Her exam is reassuring as her lung sounds are clear throughout.  Her vital signs are stable, she is in no acute distress.  We will start the patient on azithromycin, prednisone, and albuterol inhaler, and Promethazine DM for her symptoms.  Patient was advised that if her symptoms worsen or do not improve, she will need to follow-up, would recommend a chest x-ray at that time.  Patient verbalizes understanding, all questions were answered. Final Clinical Impressions(s) / UC Diagnoses   Final diagnoses:  Acute bronchitis, unspecified organism     Discharge Instructions       Take medication as prescribed. Increase fluids and allow for plenty of rest. May take ibuprofen or Tylenol for pain, fever, or general discomfort. Recommend using a humidifier at bedtime to help with cough. Sleep elevated on 2 pillows to help with cough.  If your cough worsens, or does not improve after this treatment, follow-up in our clinic.     ED Prescriptions     Medication Sig Dispense Auth. Provider   predniSONE (DELTASONE) 20 MG tablet Take 2 tablets (40 mg total) by mouth daily with breakfast for 5 days. 10 tablet Keashia Haskins-Warren, Alda Lea, NP   albuterol (VENTOLIN HFA) 108 (90 Base) MCG/ACT inhaler Inhale 2 puffs into the lungs every 6 (six) hours as needed for wheezing or shortness of breath. 8 g Waylon Hershey-Warren, Alda Lea, NP   azithromycin (ZITHROMAX) 250 MG tablet Take 1 tablet (250 mg total) by mouth daily. Take first 2 tablets together, then 1 every day until finished. 6 tablet Lisel Siegrist-Warren, Alda Lea, NP   promethazine-dextromethorphan (PROMETHAZINE-DM) 6.25-15 MG/5ML syrup Take 5 mLs by mouth 4 (four) times daily as needed for cough. 150 mL Aaryana Betke-Warren, Alda Lea, NP      PDMP not  reviewed this encounter.   Tish Men, NP 06/11/21 2013

## 2021-06-26 IMAGING — MG DIGITAL DIAGNOSTIC BILAT W/ TOMO W/ CAD
6 of 10 series · 6 of 30 positions shown · non-contrast
Comparison: Previous exams.

CLINICAL DATA: 39-year-old female with a palpable area of concern
in the left breast. The patient states she has had a prior left
breast biopsy greater than 10 years ago in [place] with pathology
revealing a fibroadenoma.

EXAM:
DIGITAL DIAGNOSTIC BILATERAL MAMMOGRAM WITH TOMO AND CAD; ULTRASOUND
RIGHT BREAST LIMITED; ULTRASOUND LEFT BREAST LIMITED

[L MLO synth-2D]
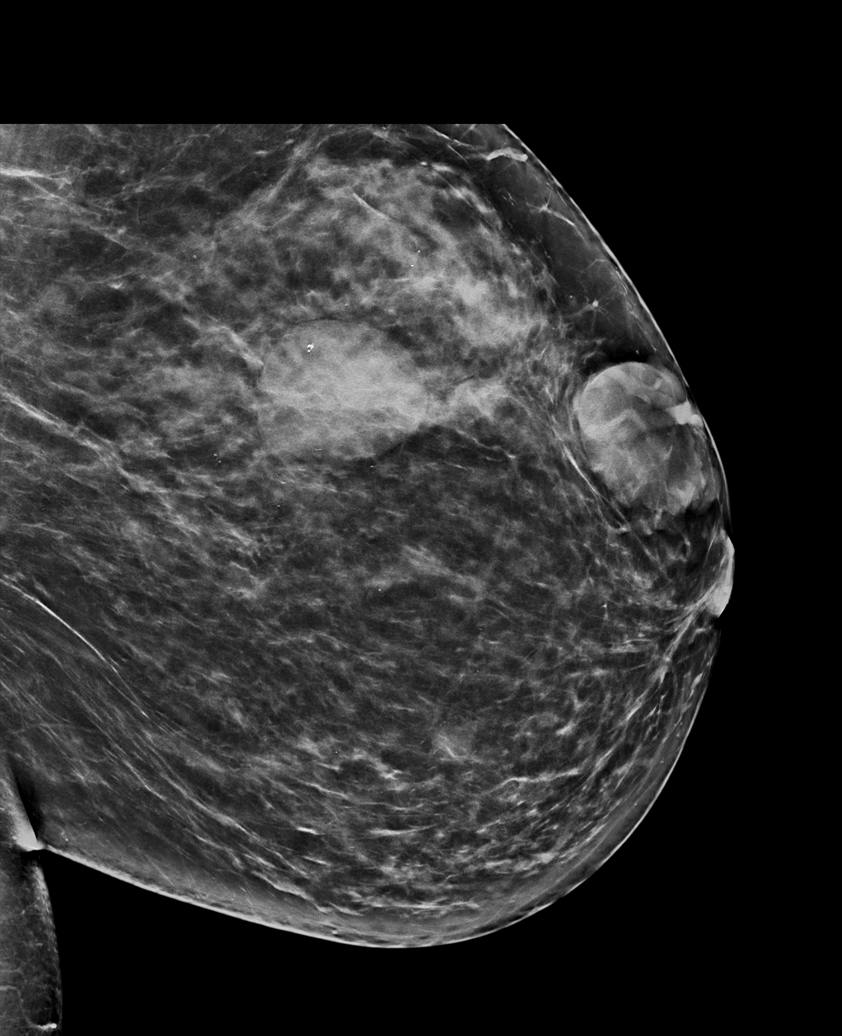

[R CC synth-2D]
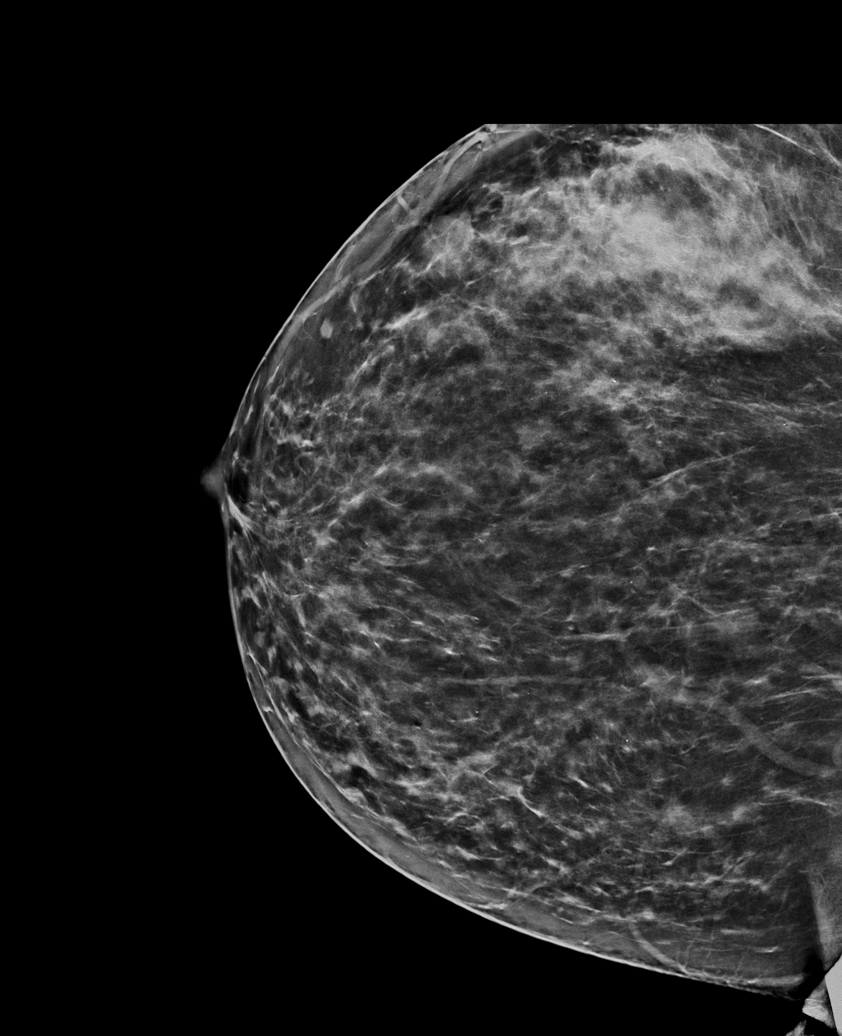

[R MLO synth-2D]
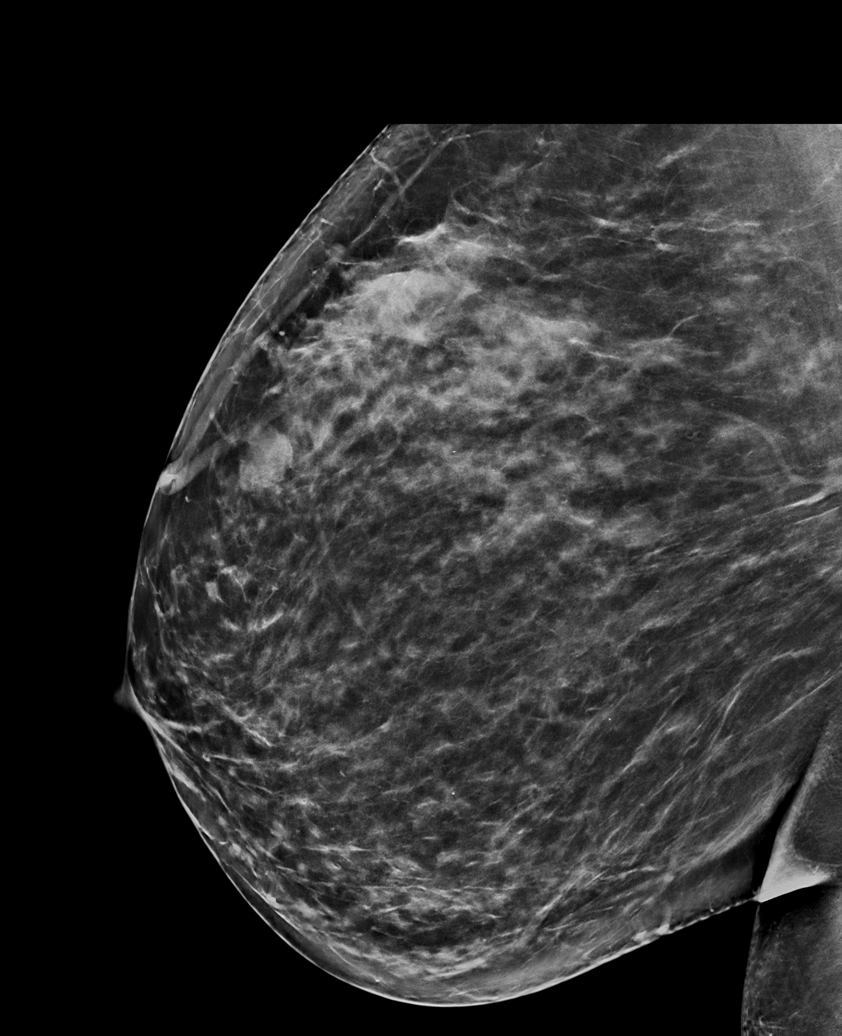

[L TAN synth-2D]
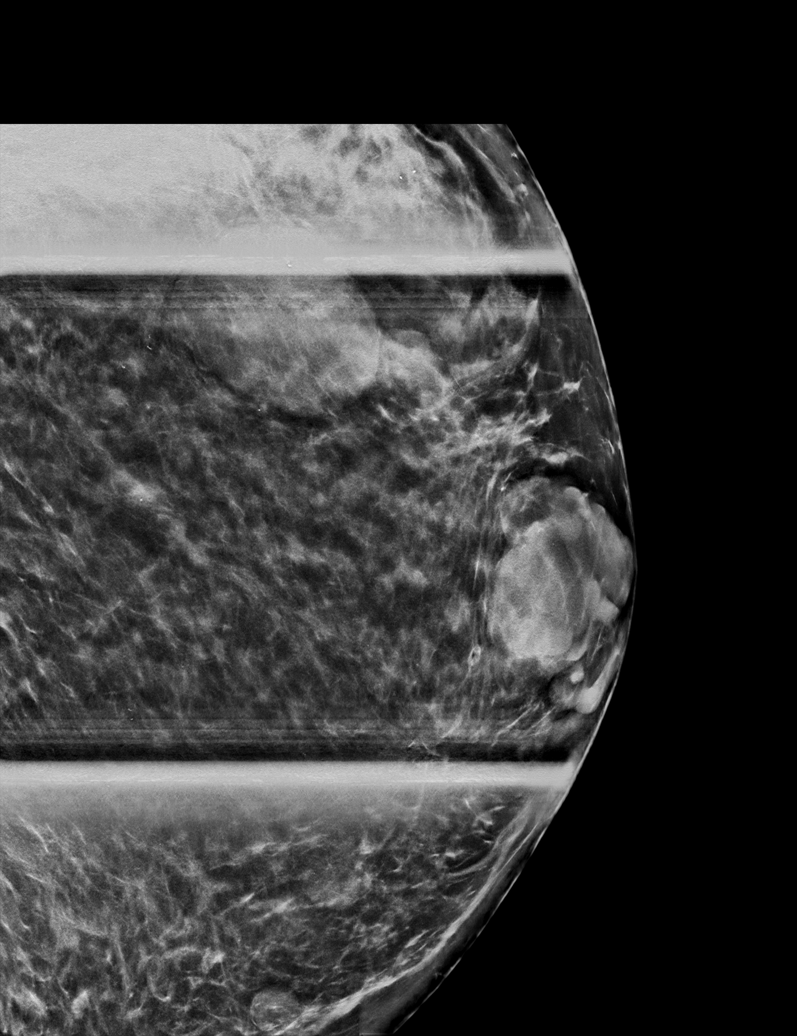

[L CC synth-2D]
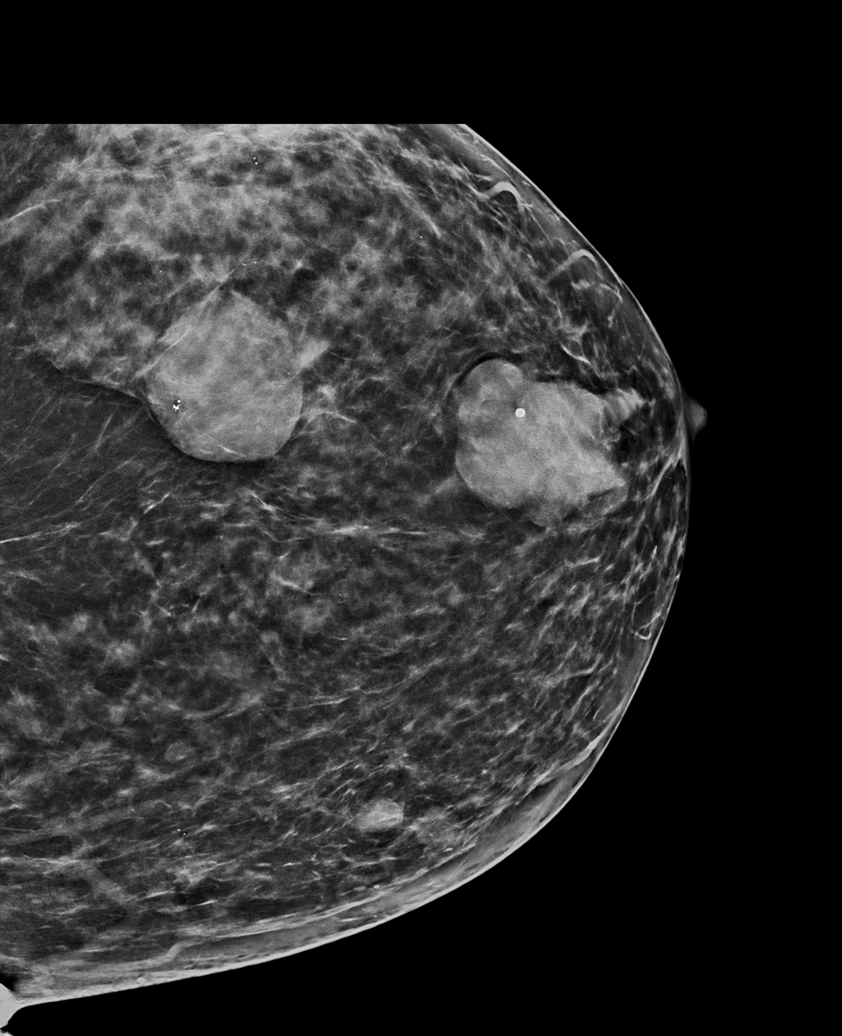

[L TAN tomo · tomo slice 43/85.0]
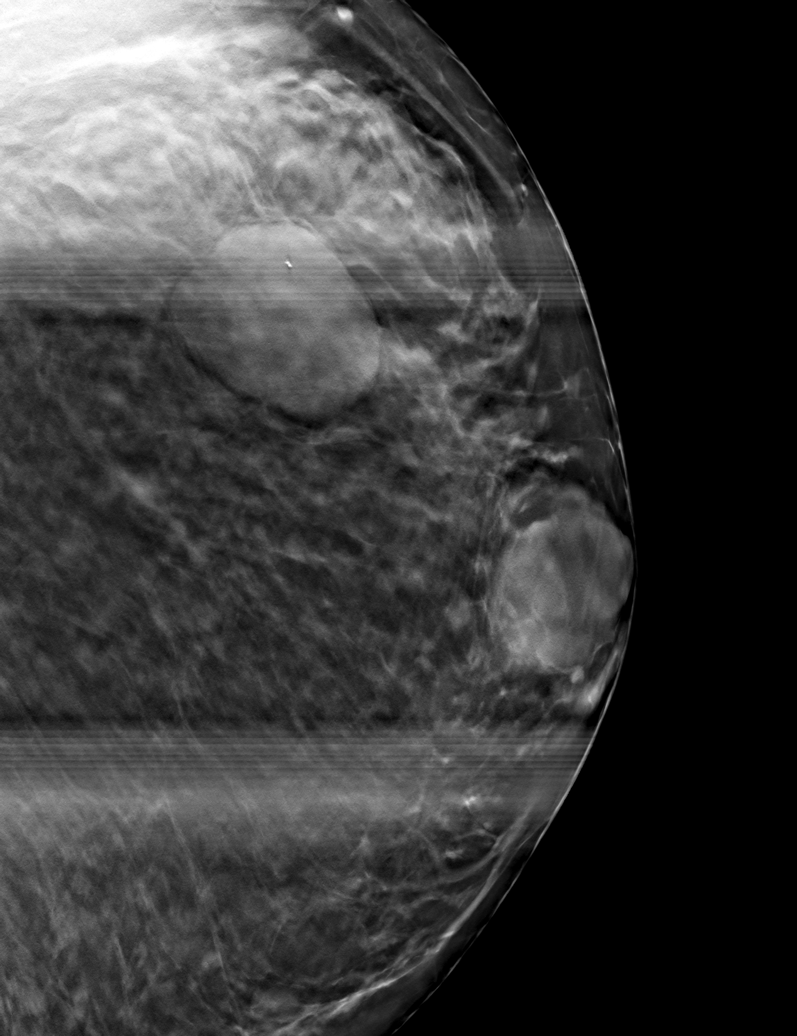

[6 of 30 positions shown; findings below may reference images not displayed]

ACR Breast Density Category b: There are scattered areas of
fibroglandular density.
FINDINGS: Multiple oval and round circumscribed masses are present in the
bilateral breasts, the largest of which at site of palpable concern
in the left breast measures approximately 4.6 cm and an additional
large oval mass measures 4.1 cm. The largest oval circumscribed mass
in the upper-outer right breast measures 2.5 cm. No biopsy marking
clips identified in either breast.

Mammographic images were processed with CAD.

Physical examination at site of palpable concern reveals a firm
slightly mobile mass in the central to slightly upper left breast.
Targeted ultrasound of the left breast was performed. There is an
oval circumscribed multilobulated mass in the left breast at 12
o'clock 4 cm from nipple measuring 4.8 x 2.3 x 4.5 cm. This
corresponds with the site of palpable concern and mammography
findings. There is an oval circumscribed hypoechoic mass in the left
breast at 2 o'clock 6 cm from nipple measuring 1.6 x 0.8 x 1.6 cm.
An additional smaller mass at the [DATE] position 8 cm from nipple
measures 0.5 x 0.5 x 0.6 cm. A oval circumscribed mass at 2 o'clock
8 cm from nipple measures 3.8 x 2 x 4.8 cm. No lymphadenopathy seen
in the left axilla.

Targeted ultrasound of the right breast was performed. There is an
oval circumscribed lobulated mass in the right breast at 10 o'clock
7 cm from nipple measuring 2.5 x 0.9 x 1.7 cm. This corresponds well
with the mass seen in the upper-outer right breast at mammography.
IMPRESSION: Multiple bilateral breast masses demonstrating imaging features most
suggestive of fibroadenomas, the largest of which at site of
palpable concern in the left breast measures 4.8 x 2.3 x 4.5 cm.

RECOMMENDATION:
1. Recommend ultrasound-guided core biopsy of the palpable mass in
the left breast at the 12 o'clock position.

2. If biopsy of the above mass returns as benign, then recommend
short term follow-up bilateral diagnostic mammography and ultrasound
of the additional probable fibroadenomas in the bilateral breasts.

3. If biopsy of the above mass returns as abnormal/surgical, then
recommend additional ultrasound-guided core biopsies of the
additional largest masses (right breast 10 o'clock and left breast 2
o'clock) in each breast.

I have discussed the findings and recommendations with the patient.
If applicable, a reminder letter will be sent to the patient
regarding the next appointment.

BI-RADS CATEGORY  4: Suspicious.

## 2021-08-09 ENCOUNTER — Other Ambulatory Visit (INDEPENDENT_AMBULATORY_CARE_PROVIDER_SITE_OTHER): Payer: Medicare Other | Admitting: *Deleted

## 2021-08-09 DIAGNOSIS — R350 Frequency of micturition: Secondary | ICD-10-CM | POA: Diagnosis not present

## 2021-08-09 DIAGNOSIS — R300 Dysuria: Secondary | ICD-10-CM | POA: Diagnosis not present

## 2021-08-09 LAB — POCT URINALYSIS DIPSTICK OB
Glucose, UA: NEGATIVE
Nitrite, UA: NEGATIVE

## 2021-08-09 NOTE — Progress Notes (Signed)
   NURSE VISIT- UTI SYMPTOMS   SUBJECTIVE:  Destiny Fischer is a 41 y.o. [id number] female here for UTI symptoms. She is a GYN patient. She reports dysuria and urinary frequency.  OBJECTIVE:  There were no vitals taken for this visit.  Appears well, in no apparent distress  Results for orders placed or performed in visit on 08/09/21 (from the past 24 hour(s))  POC Urinalysis Dipstick OB   Collection Time: 08/09/21 11:26 AM  Result Value Ref Range   Color, UA     Clarity, UA     Glucose, UA Negative Negative   Bilirubin, UA     Ketones, UA trace    Spec Grav, UA     Blood, UA small    pH, UA     POC,PROTEIN,UA Trace Negative, Trace, Small (1+), Moderate (2+), Large (3+), 4+   Urobilinogen, UA     Nitrite, UA neg    Leukocytes, UA Moderate (2+) (A) Negative   Appearance     Odor      ASSESSMENT: GYN patient with UTI symptoms and negative nitrites  PLAN: Discussed with Destiny Fischer, AGNP   Rx sent by provider today: No Urine culture sent Call or return to clinic prn if these symptoms worsen or fail to improve as anticipated. Follow-up: as needed   Destiny Fischer  08/09/2021 11:27 AM

## 2021-08-10 LAB — URINALYSIS
Bilirubin, UA: NEGATIVE
Glucose, UA: NEGATIVE
Ketones, UA: NEGATIVE
Nitrite, UA: NEGATIVE
Specific Gravity, UA: 1.024 (ref 1.005–1.030)
Urobilinogen, Ur: 0.2 mg/dL (ref 0.2–1.0)
pH, UA: 5.5 (ref 5.0–7.5)

## 2021-08-13 ENCOUNTER — Other Ambulatory Visit: Payer: Self-pay | Admitting: Adult Health

## 2021-08-13 ENCOUNTER — Telehealth: Payer: Self-pay | Admitting: *Deleted

## 2021-08-13 LAB — URINE CULTURE

## 2021-08-13 MED ORDER — SULFAMETHOXAZOLE-TRIMETHOPRIM 800-160 MG PO TABS: 1.0000 | ORAL_TABLET | Freq: Two times a day (BID) | ORAL | 0 refills | Status: DC

## 2021-08-13 NOTE — Telephone Encounter (Signed)
Mail box full @ 3:16 pm. JSY

## 2021-08-13 NOTE — Telephone Encounter (Signed)
-----   Message from Destiny Dooms, NP sent at 08/13/2021  1:39 PM EDT ----- Let her know +UIT and rx sent in for septra ds and push fluids

## 2021-08-13 NOTE — Progress Notes (Signed)
+  [facility]I will rx septra ds

## 2021-08-14 NOTE — Telephone Encounter (Signed)
Pt aware urine culture shows UTI. Med has been sent to pharmacy. Finish med and push fluids. Pt voiced understanding. [name]

## 2021-09-13 ENCOUNTER — Encounter: Payer: Self-pay | Admitting: Obstetrics & Gynecology

## 2021-09-13 ENCOUNTER — Other Ambulatory Visit (HOSPITAL_COMMUNITY)
Admission: RE | Admit: 2021-09-13 | Discharge: 2021-09-13 | Disposition: A | Discharge status: Home / Self Care | Payer: Medicare Other | Source: Ambulatory Visit | Attending: Obstetrics & Gynecology | Admitting: Obstetrics & Gynecology

## 2021-09-13 ENCOUNTER — Ambulatory Visit (INDEPENDENT_AMBULATORY_CARE_PROVIDER_SITE_OTHER): Payer: Medicare Other | Admitting: Obstetrics & Gynecology

## 2021-09-13 VITALS — BP 154/99 | HR 72 | Wt 219.8 lb

## 2021-09-13 DIAGNOSIS — Z1151 Encounter for screening for human papillomavirus (HPV): Secondary | ICD-10-CM | POA: Insufficient documentation

## 2021-09-13 DIAGNOSIS — Z1231 Encounter for screening mammogram for malignant neoplasm of breast: Secondary | ICD-10-CM

## 2021-09-13 DIAGNOSIS — Z3009 Encounter for other general counseling and advice on contraception: Secondary | ICD-10-CM

## 2021-09-13 DIAGNOSIS — Z01419 Encounter for gynecological examination (general) (routine) without abnormal findings: Secondary | ICD-10-CM

## 2021-09-13 NOTE — Patient Instructions (Signed)
Please schedule a mammogram at one of the following locations:  [place]: [phone]  [facility] [place]:[phone] [address]  

## 2021-09-13 NOTE — Progress Notes (Signed)
WELL-WOMAN EXAMINATION Patient name: Destiny Fischer MRN [id number]  Date of birth: [birth year] Chief Complaint:   Follow-up (Wants to discuss some things with Dr Nelda Marseille)  History of Present Illness:   Destiny Fischer is a 41 y.o. [phone] female being seen today for a routine well-woman exam and to discuss fertility.  Pt had tubal ligation ~ 35yr ago.  She is now with a new partner and wishes to reconsider a future pregnancy.  She wants to discuss tubal reversal.  Additionally, wants to review her medical problems/medications and if pregnancy is "safe."   No LMP recorded. (Menstrual status: Irregular Periods). Denies issues with her menses The current method of family planning is tubal ligation.    Last pap not sure.  Last mammogram: 2021. Last colonoscopy: n/a     07/21/2019   11:18 AM 05/18/2019    2:34 PM 05/29/2017    3:46 PM  Depression screen PHQ 2/9  Decreased Interest 2 0 2  Down, Depressed, Hopeless 3 0 1  PHQ - 2 Score 5 0 3  Altered sleeping 3 0 3  Tired, decreased energy 3 0 3  Change in appetite 1 0 3  Feeling bad or failure about yourself  3 0 1  Trouble concentrating  0 1  Moving slowly or fidgety/restless  0 1  Suicidal thoughts  0 0  PHQ-9 Score 15 0 15  Difficult doing work/chores  Not difficult at all Somewhat difficult      Review of Systems:   Pertinent items are noted in HPI Denies any headaches, blurred vision, fatigue, shortness of breath, chest pain, abdominal pain, bowel movements, urination, or intercourse unless otherwise stated above.  Pertinent History Reviewed:  Reviewed past medical,surgical, social and family history.  Reviewed problem list, medications and allergies. Physical Assessment:   Vitals:   09/13/21 1352  BP: (!) 154/99  Pulse: 72  Weight: 219 lb 12.8 oz (99.7 kg)  Body mass index is 38.94 kg/m.        Physical Examination:   General appearance - well appearing, and in no distress  Mental status - alert, oriented to person,  place, and time  Psych:  She has a normal mood and affect  Skin - warm and dry, normal color, no suspicious lesions noted  Chest - effort normal, all lung fields clear to auscultation bilaterally  Heart - normal rate and regular rhythm  Neck:  midline trachea, no thyromegaly or nodules  Breasts - breasts appear normal, no suspicious masses, no skin or nipple changes or  axillary nodes  Abdomen - soft, nontender, nondistended, no masses or organomegaly  Pelvic - VULVA: normal appearing vulva with no masses, tenderness or lesions  VAGINA: normal appearing vagina with normal color and discharge, no lesions  CERVIX: normal appearing cervix without discharge or lesions, no CMT  Thin prep pap is done with HR HPV cotesting  UTERUS: uterus is felt to be normal size, shape, consistency and nontender   ADNEXA: No adnexal masses or tenderness noted.  Extremities:  No swelling or varicosities noted  Chaperone: ACelene Squibb    Assessment & Plan:  1) Well-Woman Exam -pap colleted, reviewed ASCCP guidelines -mammogram ordered  2) Infertility/Family planning -discussed that tubal reversal is not typically a surgery that is offered due to the advancements of ART, specifically IVF -discussed concerns regarding AMA and decreased ovarian reserve -discussed that ideally next step would be consult with [facility] regarding IVF- pt given list of names should she desire to pursue  this  -discussed optimization of her personal health prior to pregnancy -elevated BP noted today- per pt it was because it was too tight on her am, denies h/o chronic HTN -reviewed her concerns regarding naproxen and pregnancy and that she would not continue this medication during pregnancy -questions/concerns were addressed  Majority of today's visit was spent in consultation regarding infertility/above information- 15mn  Orders Placed This Encounter  Procedures   MM 3D SCREEN BREAST BILATERAL    Meds: No orders of the defined  types were placed in this encounter.   Follow-up: Return in about 1 year (around 09/14/2022) for Annual, please print AVS.   JJanyth Pupa DO Attending [place] [facility] [place]

## 2021-09-19 LAB — CYTOLOGY - PAP
Comment: NEGATIVE
Diagnosis: NEGATIVE
High risk HPV: NEGATIVE

## 2021-10-16 ENCOUNTER — Telehealth: Payer: Self-pay | Admitting: Family Medicine

## 2021-10-16 NOTE — Telephone Encounter (Signed)
No answer unable to leave a message for patient to call back and schedule Medicare Annual Wellness Visit (AWV)  Please offer to do virtually or by telephone.  No hx of AWV eligible for AWVI per [facility] as of  07/21/2020  Please schedule at any time with [facility]-Nurse Health Advisor.      45 minute appointment   Any questions, please call me at [phone]

## 2022-09-11 ENCOUNTER — Ambulatory Visit: Payer: Medicare Other | Admitting: Obstetrics & Gynecology
